# Patient Record
Sex: Female | Born: 1985 | Race: White | Hispanic: No | Marital: Married | Smoking: Never smoker
Health system: Southern US, Community
[De-identification: ages and names within clinical notes are randomized; demographics above are authoritative.]

## PROBLEM LIST (undated history)

## (undated) DIAGNOSIS — Z8619 Personal history of other infectious and parasitic diseases: Secondary | ICD-10-CM

## (undated) DIAGNOSIS — R87629 Unspecified abnormal cytological findings in specimens from vagina: Secondary | ICD-10-CM

## (undated) HISTORY — PX: MYRINGOTOMY: SHX2060

## (undated) HISTORY — PX: COLPOSCOPY: SHX161

## (undated) HISTORY — PX: WISDOM TOOTH EXTRACTION: SHX21

## (undated) HISTORY — DX: Personal history of other infectious and parasitic diseases: Z86.19

## (undated) HISTORY — DX: Unspecified abnormal cytological findings in specimens from vagina: R87.629

---

## 2014-11-29 LAB — OB RESULTS CONSOLE HIV ANTIBODY (ROUTINE TESTING): HIV: NONREACTIVE

## 2014-11-29 LAB — OB RESULTS CONSOLE GC/CHLAMYDIA
Chlamydia: NEGATIVE
GC PROBE AMP, GENITAL: NEGATIVE

## 2014-11-29 LAB — OB RESULTS CONSOLE RUBELLA ANTIBODY, IGM: RUBELLA: IMMUNE

## 2014-11-29 LAB — OB RESULTS CONSOLE RPR: RPR: NONREACTIVE

## 2014-11-29 LAB — OB RESULTS CONSOLE HEPATITIS B SURFACE ANTIGEN: Hepatitis B Surface Ag: NEGATIVE

## 2014-11-29 LAB — OB RESULTS CONSOLE ANTIBODY SCREEN: ANTIBODY SCREEN: NEGATIVE

## 2014-11-29 LAB — OB RESULTS CONSOLE ABO/RH: RH Type: POSITIVE

## 2015-04-10 ENCOUNTER — Inpatient Hospital Stay (HOSPITAL_COMMUNITY): Admission: AD | Admit: 2015-04-10 | Payer: Self-pay | Source: Ambulatory Visit | Admitting: Obstetrics and Gynecology

## 2015-07-01 ENCOUNTER — Telehealth (HOSPITAL_COMMUNITY): Payer: Self-pay | Admitting: *Deleted

## 2015-07-01 ENCOUNTER — Encounter (HOSPITAL_COMMUNITY): Payer: Self-pay | Admitting: *Deleted

## 2015-07-01 LAB — OB RESULTS CONSOLE GBS: GBS: NEGATIVE

## 2015-07-01 NOTE — Telephone Encounter (Signed)
Preadmission screen  

## 2015-07-02 ENCOUNTER — Inpatient Hospital Stay (HOSPITAL_COMMUNITY)
Admission: AD | Admit: 2015-07-02 | Discharge: 2015-07-04 | DRG: 766 | Disposition: A | Discharge status: Home / Self Care | Payer: BLUE CROSS/BLUE SHIELD | Source: Ambulatory Visit | Attending: Obstetrics and Gynecology | Admitting: Obstetrics and Gynecology

## 2015-07-02 ENCOUNTER — Inpatient Hospital Stay (HOSPITAL_COMMUNITY): Payer: BLUE CROSS/BLUE SHIELD | Admitting: Anesthesiology

## 2015-07-02 ENCOUNTER — Encounter (HOSPITAL_COMMUNITY)
Admission: AD | Disposition: A | Discharge status: Home / Self Care | Payer: Self-pay | Source: Ambulatory Visit | Attending: Obstetrics and Gynecology

## 2015-07-02 ENCOUNTER — Encounter (HOSPITAL_COMMUNITY): Payer: Self-pay | Admitting: *Deleted

## 2015-07-02 DIAGNOSIS — Z3A40 40 weeks gestation of pregnancy: Secondary | ICD-10-CM

## 2015-07-02 DIAGNOSIS — O324XX0 Maternal care for high head at term, not applicable or unspecified: Secondary | ICD-10-CM | POA: Diagnosis not present

## 2015-07-02 DIAGNOSIS — Z3403 Encounter for supervision of normal first pregnancy, third trimester: Secondary | ICD-10-CM | POA: Diagnosis present

## 2015-07-02 LAB — RPR: RPR Ser Ql: NONREACTIVE

## 2015-07-02 LAB — CBC
HEMATOCRIT: 44.6 % (ref 36.0–46.0)
Hemoglobin: 15.4 g/dL — ABNORMAL HIGH (ref 12.0–15.0)
MCH: 31.7 pg (ref 26.0–34.0)
MCHC: 34.5 g/dL (ref 30.0–36.0)
MCV: 91.8 fL (ref 78.0–100.0)
PLATELETS: 159 10*3/uL (ref 150–400)
RBC: 4.86 MIL/uL (ref 3.87–5.11)
RDW: 13.8 % (ref 11.5–15.5)
WBC: 13.2 10*3/uL — AB (ref 4.0–10.5)

## 2015-07-02 LAB — ABO/RH: ABO/RH(D): O POS

## 2015-07-02 LAB — TYPE AND SCREEN
ABO/RH(D): O POS
ANTIBODY SCREEN: NEGATIVE

## 2015-07-02 SURGERY — Surgical Case
Anesthesia: Epidural | Site: Abdomen

## 2015-07-02 MED ORDER — LIDOCAINE-EPINEPHRINE (PF) 2 %-1:200000 IJ SOLN
INTRAMUSCULAR | Status: AC
  Filled 2015-07-02: qty 20

## 2015-07-02 MED ORDER — SODIUM BICARBONATE 8.4 % IV SOLN
INTRAVENOUS | Status: DC | PRN
  Administered 2015-07-02 (×3): 5 mL via EPIDURAL

## 2015-07-02 MED ORDER — ACETAMINOPHEN 500 MG PO TABS: 1000.0000 mg | ORAL_TABLET | Freq: Four times a day (QID) | ORAL | Status: DC

## 2015-07-02 MED ORDER — NALOXONE HCL 2 MG/2ML IJ SOSY
1.0000 ug/kg/h | PREFILLED_SYRINGE | INTRAVENOUS | Status: DC | PRN
  Filled 2015-07-02: qty 2

## 2015-07-02 MED ORDER — LIDOCAINE HCL (PF) 1 % IJ SOLN: 30.0000 mL | INTRAMUSCULAR | Status: DC | PRN

## 2015-07-02 MED ORDER — NALOXONE HCL 0.4 MG/ML IJ SOLN: 0.4000 mg | INTRAMUSCULAR | Status: DC | PRN

## 2015-07-02 MED ORDER — ONDANSETRON HCL 4 MG/2ML IJ SOLN: 4.0000 mg | Freq: Three times a day (TID) | INTRAMUSCULAR | Status: DC | PRN

## 2015-07-02 MED ORDER — NALBUPHINE HCL 10 MG/ML IJ SOLN: 5.0000 mg | INTRAMUSCULAR | Status: DC | PRN

## 2015-07-02 MED ORDER — PHENYLEPHRINE 40 MCG/ML (10ML) SYRINGE FOR IV PUSH (FOR BLOOD PRESSURE SUPPORT)
PREFILLED_SYRINGE | INTRAVENOUS | Status: AC
  Filled 2015-07-02: qty 10

## 2015-07-02 MED ORDER — DIPHENHYDRAMINE HCL 50 MG/ML IJ SOLN: 12.5000 mg | INTRAMUSCULAR | Status: DC | PRN

## 2015-07-02 MED ORDER — BUTORPHANOL TARTRATE 1 MG/ML IJ SOLN: 1.0000 mg | INTRAMUSCULAR | Status: DC | PRN

## 2015-07-02 MED ORDER — OXYCODONE-ACETAMINOPHEN 5-325 MG PO TABS: 2.0000 | ORAL_TABLET | ORAL | Status: DC | PRN

## 2015-07-02 MED ORDER — LIDOCAINE HCL (PF) 1 % IJ SOLN
INTRAMUSCULAR | Status: DC | PRN
  Administered 2015-07-02: 6 mL via EPIDURAL
  Administered 2015-07-02: 4 mL

## 2015-07-02 MED ORDER — MEPERIDINE HCL 25 MG/ML IJ SOLN
INTRAMUSCULAR | Status: DC | PRN
  Administered 2015-07-02: 6 mg via INTRAVENOUS
  Administered 2015-07-02: 7 mg via INTRAVENOUS
  Administered 2015-07-02 (×2): 6 mg via INTRAVENOUS

## 2015-07-02 MED ORDER — SCOPOLAMINE 1 MG/3DAYS TD PT72
1.0000 | MEDICATED_PATCH | Freq: Once | TRANSDERMAL | Status: DC
  Filled 2015-07-02: qty 1

## 2015-07-02 MED ORDER — LACTATED RINGERS IV SOLN
INTRAVENOUS | Status: DC
  Administered 2015-07-02 (×4): via INTRAVENOUS

## 2015-07-02 MED ORDER — OXYTOCIN 10 UNIT/ML IJ SOLN: 2.5000 [IU]/h | INTRAMUSCULAR | Status: DC

## 2015-07-02 MED ORDER — LACTATED RINGERS IV SOLN: 500.0000 mL | INTRAVENOUS | Status: DC | PRN

## 2015-07-02 MED ORDER — NALBUPHINE HCL 10 MG/ML IJ SOLN: 5.0000 mg | Freq: Once | INTRAMUSCULAR | Status: DC | PRN

## 2015-07-02 MED ORDER — CEFAZOLIN SODIUM-DEXTROSE 2-3 GM-% IV SOLR: 2.0000 g | INTRAVENOUS | Status: DC

## 2015-07-02 MED ORDER — ONDANSETRON HCL 4 MG/2ML IJ SOLN
INTRAMUSCULAR | Status: DC | PRN
  Administered 2015-07-02: 4 mg via INTRAVENOUS

## 2015-07-02 MED ORDER — PHENYLEPHRINE HCL 10 MG/ML IJ SOLN
INTRAMUSCULAR | Status: DC | PRN
  Administered 2015-07-02: 40 ug via INTRAVENOUS
  Administered 2015-07-02 (×2): 80 ug via INTRAVENOUS
  Administered 2015-07-02: 40 ug via INTRAVENOUS
  Administered 2015-07-02: 80 ug via INTRAVENOUS

## 2015-07-02 MED ORDER — ERYTHROMYCIN 5 MG/GM OP OINT
TOPICAL_OINTMENT | OPHTHALMIC | Status: AC
  Filled 2015-07-02: qty 1

## 2015-07-02 MED ORDER — DIPHENHYDRAMINE HCL 25 MG PO CAPS: 25.0000 mg | ORAL_CAPSULE | Freq: Four times a day (QID) | ORAL | Status: DC | PRN

## 2015-07-02 MED ORDER — SODIUM CHLORIDE 0.9 % IJ SOLN: 3.0000 mL | INTRAMUSCULAR | Status: DC | PRN

## 2015-07-02 MED ORDER — PHENYLEPHRINE 40 MCG/ML (10ML) SYRINGE FOR IV PUSH (FOR BLOOD PRESSURE SUPPORT)
80.0000 ug | PREFILLED_SYRINGE | INTRAVENOUS | Status: DC | PRN
  Filled 2015-07-02: qty 20

## 2015-07-02 MED ORDER — OXYTOCIN 10 UNIT/ML IJ SOLN
40.0000 [IU] | INTRAVENOUS | Status: DC | PRN
  Administered 2015-07-02: 40 [IU] via INTRAVENOUS

## 2015-07-02 MED ORDER — EPHEDRINE 5 MG/ML INJ: 10.0000 mg | INTRAVENOUS | Status: DC | PRN

## 2015-07-02 MED ORDER — FENTANYL CITRATE (PF) 100 MCG/2ML IJ SOLN: 25.0000 ug | INTRAMUSCULAR | Status: DC | PRN

## 2015-07-02 MED ORDER — SIMETHICONE 80 MG PO CHEW
80.0000 mg | CHEWABLE_TABLET | ORAL | Status: DC
  Administered 2015-07-03 – 2015-07-04 (×2): 80 mg via ORAL
  Filled 2015-07-02 (×2): qty 1

## 2015-07-02 MED ORDER — MEPERIDINE HCL 25 MG/ML IJ SOLN: 6.2500 mg | INTRAMUSCULAR | Status: DC | PRN

## 2015-07-02 MED ORDER — CEFAZOLIN SODIUM-DEXTROSE 2-3 GM-% IV SOLR
INTRAVENOUS | Status: DC | PRN
  Administered 2015-07-02: 2 g via INTRAVENOUS

## 2015-07-02 MED ORDER — MEPERIDINE HCL 25 MG/ML IJ SOLN
INTRAMUSCULAR | Status: AC
  Filled 2015-07-02: qty 1

## 2015-07-02 MED ORDER — MENTHOL 3 MG MT LOZG: 1.0000 | LOZENGE | OROMUCOSAL | Status: DC | PRN

## 2015-07-02 MED ORDER — SODIUM CHLORIDE 0.9 % IR SOLN
Status: DC | PRN
  Administered 2015-07-02: 1000 mL

## 2015-07-02 MED ORDER — TETANUS-DIPHTH-ACELL PERTUSSIS 5-2.5-18.5 LF-MCG/0.5 IM SUSP
0.5000 mL | Freq: Once | INTRAMUSCULAR | Status: AC
  Administered 2015-07-04: 0.5 mL via INTRAMUSCULAR
  Filled 2015-07-02: qty 0.5

## 2015-07-02 MED ORDER — TERBUTALINE SULFATE 1 MG/ML IJ SOLN: 0.2500 mg | Freq: Once | INTRAMUSCULAR | Status: DC | PRN

## 2015-07-02 MED ORDER — ACETAMINOPHEN 500 MG PO TABS
1000.0000 mg | ORAL_TABLET | Freq: Four times a day (QID) | ORAL | Status: AC
  Administered 2015-07-03 (×3): 1000 mg via ORAL
  Filled 2015-07-02 (×3): qty 2

## 2015-07-02 MED ORDER — SIMETHICONE 80 MG PO CHEW
80.0000 mg | CHEWABLE_TABLET | Freq: Three times a day (TID) | ORAL | Status: DC
  Administered 2015-07-03 – 2015-07-04 (×4): 80 mg via ORAL
  Filled 2015-07-02 (×5): qty 1

## 2015-07-02 MED ORDER — DIBUCAINE 1 % RE OINT: 1.0000 "application " | TOPICAL_OINTMENT | RECTAL | Status: DC | PRN

## 2015-07-02 MED ORDER — OXYTOCIN 10 UNIT/ML IJ SOLN: 2.5000 [IU]/h | INTRAVENOUS | Status: AC

## 2015-07-02 MED ORDER — OXYTOCIN 10 UNIT/ML IJ SOLN
INTRAMUSCULAR | Status: AC
  Filled 2015-07-02: qty 4

## 2015-07-02 MED ORDER — FENTANYL 2.5 MCG/ML BUPIVACAINE 1/10 % EPIDURAL INFUSION (WH - ANES)
14.0000 mL/h | INTRAMUSCULAR | Status: DC | PRN
  Administered 2015-07-02: 14 mL/h via EPIDURAL
  Filled 2015-07-02: qty 125

## 2015-07-02 MED ORDER — MORPHINE SULFATE (PF) 0.5 MG/ML IJ SOLN
INTRAMUSCULAR | Status: AC
  Filled 2015-07-02: qty 10

## 2015-07-02 MED ORDER — ONDANSETRON HCL 4 MG/2ML IJ SOLN: 4.0000 mg | Freq: Four times a day (QID) | INTRAMUSCULAR | Status: DC | PRN

## 2015-07-02 MED ORDER — MORPHINE SULFATE (PF) 0.5 MG/ML IJ SOLN
INTRAMUSCULAR | Status: DC | PRN
  Administered 2015-07-02: 4 mg via EPIDURAL
  Administered 2015-07-02: 1 mg via INTRAVENOUS

## 2015-07-02 MED ORDER — SIMETHICONE 80 MG PO CHEW: 80.0000 mg | CHEWABLE_TABLET | ORAL | Status: DC | PRN

## 2015-07-02 MED ORDER — WITCH HAZEL-GLYCERIN EX PADS: 1.0000 "application " | MEDICATED_PAD | CUTANEOUS | Status: DC | PRN

## 2015-07-02 MED ORDER — OXYTOCIN BOLUS FROM INFUSION: 500.0000 mL | INTRAVENOUS | Status: DC

## 2015-07-02 MED ORDER — SCOPOLAMINE 1 MG/3DAYS TD PT72
MEDICATED_PATCH | TRANSDERMAL | Status: DC | PRN
  Administered 2015-07-02: 1 via TRANSDERMAL

## 2015-07-02 MED ORDER — CITRIC ACID-SODIUM CITRATE 334-500 MG/5ML PO SOLN
30.0000 mL | ORAL | Status: DC | PRN
  Administered 2015-07-02: 30 mL via ORAL
  Filled 2015-07-02: qty 15

## 2015-07-02 MED ORDER — ACETAMINOPHEN 325 MG PO TABS: 650.0000 mg | ORAL_TABLET | ORAL | Status: DC | PRN

## 2015-07-02 MED ORDER — OXYTOCIN 10 UNIT/ML IJ SOLN
1.0000 m[IU]/min | INTRAVENOUS | Status: DC
  Administered 2015-07-02: 1 m[IU]/min via INTRAVENOUS

## 2015-07-02 MED ORDER — OXYTOCIN 10 UNIT/ML IJ SOLN: 1.0000 m[IU]/min | INTRAVENOUS | Status: DC

## 2015-07-02 MED ORDER — LACTATED RINGERS IV SOLN
INTRAVENOUS | Status: DC
  Administered 2015-07-03: 02:00:00 via INTRAVENOUS

## 2015-07-02 MED ORDER — OXYTOCIN 10 UNIT/ML IJ SOLN
1.0000 m[IU]/min | INTRAVENOUS | Status: DC
  Filled 2015-07-02: qty 4

## 2015-07-02 MED ORDER — OXYCODONE-ACETAMINOPHEN 5-325 MG PO TABS: 1.0000 | ORAL_TABLET | ORAL | Status: DC | PRN

## 2015-07-02 MED ORDER — PRENATAL MULTIVITAMIN CH
1.0000 | ORAL_TABLET | Freq: Every day | ORAL | Status: DC
Start: 2015-07-03 — End: 2015-07-04
  Administered 2015-07-03 – 2015-07-04 (×2): 1 via ORAL
  Filled 2015-07-02 (×2): qty 1

## 2015-07-02 MED ORDER — ONDANSETRON HCL 4 MG/2ML IJ SOLN
INTRAMUSCULAR | Status: AC
  Filled 2015-07-02: qty 2

## 2015-07-02 MED ORDER — KETOROLAC TROMETHAMINE 30 MG/ML IJ SOLN
30.0000 mg | Freq: Once | INTRAMUSCULAR | Status: AC
  Administered 2015-07-02: 30 mg via INTRAVENOUS
  Filled 2015-07-02: qty 1

## 2015-07-02 MED ORDER — IBUPROFEN 600 MG PO TABS
600.0000 mg | ORAL_TABLET | Freq: Four times a day (QID) | ORAL | Status: DC
  Administered 2015-07-03 – 2015-07-04 (×7): 600 mg via ORAL
  Filled 2015-07-02 (×7): qty 1

## 2015-07-02 MED ORDER — SCOPOLAMINE 1 MG/3DAYS TD PT72
MEDICATED_PATCH | TRANSDERMAL | Status: AC
  Filled 2015-07-02: qty 1

## 2015-07-02 MED ORDER — ZOLPIDEM TARTRATE 5 MG PO TABS: 5.0000 mg | ORAL_TABLET | Freq: Every evening | ORAL | Status: DC | PRN

## 2015-07-02 MED ORDER — FLEET ENEMA 7-19 GM/118ML RE ENEM: 1.0000 | ENEMA | RECTAL | Status: DC | PRN

## 2015-07-02 MED ORDER — LANOLIN HYDROUS EX OINT: 1.0000 "application " | TOPICAL_OINTMENT | CUTANEOUS | Status: DC | PRN

## 2015-07-02 MED ORDER — SENNOSIDES-DOCUSATE SODIUM 8.6-50 MG PO TABS
2.0000 | ORAL_TABLET | ORAL | Status: DC
  Administered 2015-07-03 – 2015-07-04 (×2): 2 via ORAL
  Filled 2015-07-02 (×2): qty 2

## 2015-07-02 SURGICAL SUPPLY — 37 items
BENZOIN TINCTURE PRP APPL 2/3 (GAUZE/BANDAGES/DRESSINGS) ×3 IMPLANT
CLAMP CORD UMBIL (MISCELLANEOUS) IMPLANT
CLOSURE STERI STRIP 1/2 X4 (GAUZE/BANDAGES/DRESSINGS) ×3 IMPLANT
CLOSURE WOUND 1/2 X4 (GAUZE/BANDAGES/DRESSINGS)
CLOTH BEACON ORANGE TIMEOUT ST (SAFETY) ×3 IMPLANT
CONTAINER PREFILL 10% NBF 15ML (MISCELLANEOUS) IMPLANT
DRAPE SHEET LG 3/4 BI-LAMINATE (DRAPES) IMPLANT
DRSG OPSITE POSTOP 4X10 (GAUZE/BANDAGES/DRESSINGS) ×3 IMPLANT
DURAPREP 26ML APPLICATOR (WOUND CARE) ×3 IMPLANT
ELECT REM PT RETURN 9FT ADLT (ELECTROSURGICAL) ×3
ELECTRODE REM PT RTRN 9FT ADLT (ELECTROSURGICAL) ×1 IMPLANT
EXTRACTOR VACUUM M CUP 4 TUBE (SUCTIONS) IMPLANT
EXTRACTOR VACUUM M CUP 4' TUBE (SUCTIONS)
GLOVE BIO SURGEON STRL SZ8 (GLOVE) ×3 IMPLANT
GLOVE BIOGEL PI IND STRL 7.0 (GLOVE) ×1 IMPLANT
GLOVE BIOGEL PI INDICATOR 7.0 (GLOVE) ×2
GOWN STRL REUS W/TWL LRG LVL3 (GOWN DISPOSABLE) ×6 IMPLANT
KIT ABG SYR 3ML LUER SLIP (SYRINGE) ×3 IMPLANT
LIQUID BAND (GAUZE/BANDAGES/DRESSINGS) IMPLANT
NEEDLE HYPO 25X5/8 SAFETYGLIDE (NEEDLE) ×3 IMPLANT
NS IRRIG 1000ML POUR BTL (IV SOLUTION) ×3 IMPLANT
PACK C SECTION WH (CUSTOM PROCEDURE TRAY) ×3 IMPLANT
PAD ABD 7.5X8 STRL (GAUZE/BANDAGES/DRESSINGS) ×3 IMPLANT
PAD OB MATERNITY 4.3X12.25 (PERSONAL CARE ITEMS) ×3 IMPLANT
PENCIL SMOKE EVAC W/HOLSTER (ELECTROSURGICAL) ×3 IMPLANT
SPONGE GAUZE 4X4 12PLY STER LF (GAUZE/BANDAGES/DRESSINGS) ×6 IMPLANT
STRIP CLOSURE SKIN 1/2X4 (GAUZE/BANDAGES/DRESSINGS) IMPLANT
SUT MNCRL 0 VIOLET CTX 36 (SUTURE) ×4 IMPLANT
SUT MONOCRYL 0 CTX 36 (SUTURE) ×8
SUT PDS AB 0 CTX 60 (SUTURE) ×3 IMPLANT
SUT PLAIN 0 NONE (SUTURE) IMPLANT
SUT PLAIN 2 0 (SUTURE) ×2
SUT PLAIN 2 0 XLH (SUTURE) IMPLANT
SUT PLAIN ABS 2-0 CT1 27XMFL (SUTURE) ×1 IMPLANT
SUT VIC AB 4-0 KS 27 (SUTURE) ×3 IMPLANT
TOWEL OR 17X24 6PK STRL BLUE (TOWEL DISPOSABLE) ×3 IMPLANT
TRAY FOLEY CATH SILVER 14FR (SET/KITS/TRAYS/PACK) ×3 IMPLANT

## 2015-07-02 NOTE — MAU Note (Signed)
Was 5 cm at office yesterday morning.  Contractions every 5 min or less now.  Blood tinge mucus.  No leaking . Baby moving well.

## 2015-07-02 NOTE — Transfer of Care (Signed)
Immediate Anesthesia Transfer of Care Note  Patient: Cynthia [name]  Procedure(s) Performed: Procedure(s): CESAREAN SECTION (N/A)  Patient Location: PACU  Anesthesia Type:epidural;  Level of Consciousness: awake, alert , oriented and patient cooperative  Airway & Oxygen Therapy: Patient Spontanous Breathing  Post-op Assessment: Report given to RN and Post -op Vital signs reviewed and stable  Post vital signs: Reviewed and stable  Last Vitals:  Filed Vitals:   07/02/15 1600 07/02/15 1828  BP: 103/57   Pulse: 78   Temp: 36.6 C 36.8 C  Resp: 18 17    Complications: No apparent anesthesia complications

## 2015-07-02 NOTE — Op Note (Signed)
NAMMilinda Pointer:  [name], Cynthia              ACCOUNT NO.:  [id number]  MEDICAL RECORD NO.:  [id number]  LOCATION:  9104                          FACILITY:  [facility]  PHYSICIAN:  Guy SandiferJames E. Henderson Cloudomblin, M.D. DATE OF BIRTH:  06-30-1985  DATE OF PROCEDURE:  07/02/2015 DATE OF DISCHARGE:                              OPERATIVE REPORT   PREOPERATIVE DIAGNOSIS:  Arrest of descent.  POSTOPERATIVE DIAGNOSIS:  Arrest of descent.  PROCEDURE:  Primary low transverse cesarean section.  SURGEON:  Guy SandiferJames E. Henderson Cloudomblin, MD  ANESTHESIA:  Epidural, Belva AgeeKyle E. Jackson, MD  ESTIMATED BLOOD LOSS:  450 mL.  SPECIMENS:  None.  FINDINGS:  Viable female infant.  Apgars, birth weight, arterial cord, pH pending.  INDICATIONS AND CONSENT:  This patient is a 30 year old, G1, P0, at 7640- 2/7 weeks who presents in spontaneous labor.  Cervix was unchanged after several hours, and recommendation for cesarean section was made. Potential risks and complications were discussed with the patient preoperatively including, but not limited to, infection, organ damage, bleeding requiring transfusion of blood products with HIV and hepatitis acquisition, DVT, PE, pneumonia.  All questions were answered and consent was signed on the chart.  PROCEDURE IN DETAIL:  The patient was taken to the operating room, where she was identified and placed in dorsal supine position with a 15 degree left lateral wedge.  Her epidurals were augmented to a surgical level. Foley catheter was placed and she was prepped and draped in a sterile fashion per [facility] protocol.  After testing for adequate epidural anesthesia, skin was entered through a Pfannenstiel incision and dissection was carried out in layers of the peritoneum.  Peritoneum was incised and extended superiorly and inferiorly.  Vesicouterine peritoneum was taken down cephalolaterally and bladder flap was developed and the bladder blade was placed.  The uterus was incised in a low transverse  manner and the uterine cavity was entered bluntly with a hemostat.  Uterine incision was extended cephalolaterally with the fingers.  Vertex was then delivered.  Nuchal cord x3 was then reduced. Baby was delivered without difficulty.  Good cry and tone was noted. Cord was clamped and cut and the baby was handed to awaiting pediatrics team.  Placenta was manually delivered.  Uterine cavity was clean. Uterus was closed in 2 running locking imbricating layers of 0 Monocryl suture which achieves good hemostasis.  Anterior peritoneum was closed in running fashion with 0 Monocryl suture which was also used to reapproximate the pyramidalis muscle in midline.  Anterior rectus fascia was closed in a running fashion with a 0 looped PDS suture. Skin was closed with interrupted plain and the skin was closed in a subcuticular fashion with 4-0 Vicryl on a Keith needle.  Steri-Strips were applied.  All counts were correct.  The patient was taken to recovery room in stable condition.     Guy SandiferJames E. Henderson Cloudomblin, M.D.     JET/MEDQ  D:  07/02/2015  T:  07/02/2015  Job:  [id number]

## 2015-07-02 NOTE — Consult Note (Signed)
[facility]  Delivery Note:  C-section       07/02/2015  5:48 PM  I was called to the operating room at the request of the patient's obstetrician (Dr. Tomblin) for a primary c-section for failure to progress.  PRENATAL HX:  This is a 29 y/o G1P0 at 39 weeks who was admitted this morning in active labor, but infant delivered by c-section for failure to progress.  No pregnancy complications.  ROM was at 0619 this morning, so ROM ~12 hours.    DELIVERY:  Infant was vigorous at delivery, requiring no resuscitation other than standard warming, drying and stimulation.  APGARs 8 and 9.  Exam notable for molding, otherwise was within normal limits.  After 5 minutes, baby left with nurse to assist parents with skin-to-skin care.   _____________________ Electronically Signed By: Darlina Mccaughey, MD Neonatologist  

## 2015-07-02 NOTE — Anesthesia Postprocedure Evaluation (Signed)
Anesthesia Post Note  Patient: Cynthia [name]  Procedure(s) Performed: Procedure(s) (LRB): CESAREAN SECTION (N/A)  Patient location during evaluation: PACU Anesthesia Type: Spinal Level of consciousness: awake Pain management: satisfactory to patient Vital Signs Assessment: post-procedure vital signs reviewed and stable Respiratory status: spontaneous breathing Cardiovascular status: blood pressure returned to baseline Postop Assessment: no headache and spinal receding Anesthetic complications: no    Last Vitals:  Filed Vitals:   07/02/15 1915 07/02/15 1930  BP: 107/50 103/54  Pulse: 87 87  Temp:    Resp: 17 22    Last Pain:  Filed Vitals:   07/02/15 1935  PainSc: 0-No pain                 Dantae Meunier EDWARD

## 2015-07-02 NOTE — Progress Notes (Signed)
07/02/2015  8:15 PM  PATIENT:  Cynthia [name]  30 y.o. female  PRE-OPERATIVE DIAGNOSIS:  Arrest of descent  POST-OPERATIVE DIAGNOSIS:  Arrest of descent  PROCEDURE:  Procedure(s): CESAREAN SECTION (N/A)  SURGEON:  Surgeon(s) and Role:    * Harold HedgeJames Undray Allman, MD - Primary  PHYSICIAN ASSISTANT:   ASSISTANTS: none   ANESTHESIA:   epidural  EBL:  Total I/O In: 67 [I.V.:67] Out: 525 [Urine:525]  BLOOD ADMINISTERED:none  DRAINS: Urinary Catheter (Foley)   LOCAL MEDICATIONS USED:  NONE  SPECIMEN:  No Specimen  DISPOSITION OF SPECIMEN:  N/A  COUNTS:  YES  TOURNIQUET:  * No tourniquets in log *  DICTATION: .Other Dictation: Dictation Number [id number]  PLAN OF CARE: Admit to inpatient   PATIENT DISPOSITION:  PACU - hemodynamically stable.   Delay start of Pharmacological VTE agent (>24hrs) due to surgical blood loss or risk of bleeding: not applicable

## 2015-07-02 NOTE — Addendum Note (Signed)
Addendum  created 07/02/15 2218 by Cristela BlueKyle Belisa Eichholz, MD   Modules edited: Orders, PRL Based Order Sets

## 2015-07-02 NOTE — Progress Notes (Signed)
Cx 7/C/-2 IUPC placed FHT cat one

## 2015-07-02 NOTE — Anesthesia Preprocedure Evaluation (Signed)

## 2015-07-02 NOTE — H&P (Signed)
Cynthia [name] is a [age] y.o. female presenting for UCs. Reports U/S in office yesterday with EFW about 8#2oz. No ROM, no HA. Maternal Medical History:  Reason for admission: Contractions.   Contractions: Onset was 3-5 hours ago.    Fetal activity: Perceived fetal activity is normal.      OB History    Gravida Para Term Preterm AB TAB SAB Ectopic Multiple Living   1              Past Medical History  Diagnosis Date  . Hx of varicella   . Vaginal Pap smear, abnormal    Past Surgical History  Procedure Laterality Date  . Wisdom tooth extraction    . Myringotomy    . Colposcopy     Family History: family history is not on file. Social History:  reports that she has never smoked. She does not have any smokeless tobacco history on file. She reports that she does not drink alcohol or use illicit drugs.   Prenatal Transfer Tool  Maternal Diabetes: No Genetic Screening: Normal Maternal Ultrasounds/Referrals: Normal Fetal Ultrasounds or other Referrals:  None Maternal Substance Abuse:  No Significant Maternal Medications:  None Significant Maternal Lab Results:  None Other Comments:  None  Review of Systems  Eyes: Negative for blurred vision.  Gastrointestinal: Negative for abdominal pain.  Neurological: Negative for headaches.    Dilation: 6 Effacement (%): 100 Station: -2 Exam by:: Cynthia [name] Blood pressure 113/69, pulse 104, temperature 97.8 F (36.6 C), temperature source Oral, resp. rate 18, height 5\' 1"  (1.549 m), weight 163 lb 6 oz (74.106 kg), last menstrual period 09/18/2014, SpO2 99 %. Maternal Exam:  Uterine Assessment: Contraction strength is moderate.  Contraction frequency is regular.   Abdomen: Fetal presentation: vertex     Fetal Exam Fetal State Assessment: Category I - tracings are normal.     Physical Exam  Cardiovascular: Normal rate and regular rhythm.   Respiratory: Effort normal and breath sounds normal.  GI: Soft. There is no  tenderness.  Neurological: She has normal reflexes.    Cx 6/C/-2/vtx AROM clear Prenatal labs: ABO, Rh: O/Positive/-- (06/06 0000) Antibody: Negative (06/06 0000) Rubella: Immune (06/06 0000) RPR: Nonreactive (06/06 0000)  HBsAg: Negative (06/06 0000)  HIV: Non-reactive (06/06 0000)  GBS: Negative (01/06 0000)   Assessment/Plan: 30 yo G1P0 @ 40 2/7 weeks in labor  All questions answered  Cynthia [name] II,Cynthia [name] E 07/02/2015, 6:22 AM

## 2015-07-02 NOTE — Progress Notes (Signed)
Cx no change D/W patient and husband lack of progress over several hours Recommend cesarean section Risks D/W including infection, organ damage, bleeding/transfusion-HIV/Hep, DVT/PE, pneumonia. Patient states she understands and agrees

## 2015-07-02 NOTE — Anesthesia Procedure Notes (Signed)

## 2015-07-03 LAB — CBC
HEMATOCRIT: 35.2 % — AB (ref 36.0–46.0)
Hemoglobin: 11.7 g/dL — ABNORMAL LOW (ref 12.0–15.0)
MCH: 30.6 pg (ref 26.0–34.0)
MCHC: 33 g/dL (ref 30.0–36.0)
MCV: 92.9 fL (ref 78.0–100.0)
PLATELETS: 132 10*3/uL — AB (ref 150–400)
RBC: 3.79 MIL/uL — ABNORMAL LOW (ref 3.87–5.11)
RDW: 13.8 % (ref 11.5–15.5)
WBC: 12.1 10*3/uL — AB (ref 4.0–10.5)

## 2015-07-03 NOTE — Addendum Note (Signed)
Addendum  created 07/03/15 0803 by Renford DillsJanet L Ammaar Encina, CRNA   Modules edited: Clinical Notes   Clinical Notes:  File: [id number]

## 2015-07-03 NOTE — Progress Notes (Signed)
Subjective: Postpartum Day 1: Cesarean Delivery Patient reports tolerating PO.    Objective: Vital signs in last 24 hours: Temp:  [97.7 F (36.5 C)-98.6 F (37 C)] 97.8 F (36.6 C) (01/08 0300) Pulse Rate:  [25-154] 80 (01/08 0300) Resp:  [17-22] 20 (01/08 0300) BP: (91-130)/(36-105) 108/60 mmHg (01/08 0300) SpO2:  [74 %-100 %] 97 % (01/08 0300)  Physical Exam:  General: alert, cooperative and no distress Lochia: appropriate Uterine Fundus: firm Incision: healing well DVT Evaluation: No evidence of DVT seen on physical exam.   Recent Labs  07/02/15 0600 07/03/15 0552  HGB 15.4* 11.7*  HCT 44.6 35.2*    Assessment/Plan: Status post Cesarean section. Doing well postoperatively.  Continue current care.  Annastacia Duba II,Persephanie Laatsch E 07/03/2015, 6:47 AM

## 2015-07-03 NOTE — Lactation Note (Signed)
This note was copied from the chart of Boy Milinda Pointernna Auld. Lactation Consultation Note  Patient Name: Boy Milinda Pointernna Politte ZOXWR'UToday's Date: 07/03/2015 Reason for consult: Initial assessment;Other (Comment) (per mom baby recently fed at 1400. LC reviewed Doc Flow sheets with parents )  Parents had BF questions, LC reviewed basics - importance of skin to skin feedings, average feeding time, and importance of hand expressing. Mom has already started hand expressing and feeding with a spoon. Mom encouraged to call with feeding cues for feeding assessment. Mother informed of post-discharge support and given phone number to the lactation department, including services for phone call assistance;  out-patient appointments; and breastfeeding support group. List of other breastfeeding resources in the community given in the handout. Encouraged  mother to call for problems or concerns related to breastfeeding.   Maternal Data    Feeding Feeding Type:  (per mom baby last fed at 1400 for 45 mins, with swallows ) Length of feed: 40 min  LATCH Score/Interventions                Intervention(s): Breastfeeding basics reviewed     Lactation Tools Discussed/Used Tools:  (per mom has n't had to use the NS since the 1st 2 feedings. baby has fiqured it out )   Consult Status Consult Status: Follow-up Date: 07/03/15 Follow-up type: In-patient    Kathrin Greathouseorio, Rayli Wiederhold Ann 07/03/2015, 4:59 PM

## 2015-07-03 NOTE — Anesthesia Postprocedure Evaluation (Signed)
Anesthesia Post Note  Patient: Cynthia [name]  Procedure(s) Performed: Procedure(s) (LRB): CESAREAN SECTION (N/A)  Patient location during evaluation: Mother Baby Anesthesia Type: Epidural Level of consciousness: awake Pain management: pain level controlled Vital Signs Assessment: post-procedure vital signs reviewed and stable Respiratory status: spontaneous breathing Cardiovascular status: stable Postop Assessment: no headache, no backache, epidural receding, patient able to bend at knees, no signs of nausea or vomiting and adequate PO intake Anesthetic complications: no    Last Vitals:  Filed Vitals:   07/03/15 0300 07/03/15 0610  BP: 108/60 107/62  Pulse: 80 68  Temp: 36.6 C 36.9 C  Resp: 20 20    Last Pain:  Filed Vitals:   07/03/15 0655  PainSc: 2                  [name]

## 2015-07-04 ENCOUNTER — Encounter (HOSPITAL_COMMUNITY): Payer: Self-pay | Admitting: Obstetrics and Gynecology

## 2015-07-04 MED ORDER — IBUPROFEN 600 MG PO TABS: 600.0000 mg | ORAL_TABLET | Freq: Four times a day (QID) | ORAL | Status: DC

## 2015-07-04 NOTE — Progress Notes (Signed)
Subjective: Postpartum Day 2: Cesarean Delivery Patient reports tolerating PO, + flatus and no problems voiding.    Objective: Vital signs in last 24 hours: Temp:  [97.8 F (36.6 C)-98 F (36.7 C)] 97.8 F (36.6 C) (01/09 0607) Pulse Rate:  [63-78] 66 (01/09 0607) Resp:  [16-20] 20 (01/09 0607) BP: (98-105)/(51-71) 98/58 mmHg (01/09 0607) SpO2:  [97 %-99 %] 98 % (01/08 1751)  Physical Exam:  General: alert and cooperative Lochia: appropriate Uterine Fundus: firm Incision: healing well DVT Evaluation: No evidence of DVT seen on physical exam. Negative Homan's sign. No cords or calf tenderness. Calf/Ankle edema is present.   Recent Labs  07/02/15 0600 07/03/15 0552  HGB 15.4* 11.7*  HCT 44.6 35.2*    Assessment/Plan: Status post Cesarean section. Doing well postoperatively.  Desires circ prior to discharge.  Ashari Llewellyn G 07/04/2015, 8:42 AM

## 2015-07-04 NOTE — Lactation Note (Signed)
This note was copied from the chart of Cynthia [name]. Lactation Consultation Note  Patient Name: Cynthia Milinda Pointernna Schweigert ZOXWR'UToday's Date: 07/04/2015 Reason for consult: Follow-up assessment  Baby 44 hours old. Mom nursing baby when this LC entered the room. Baby latched deeply, suckling rhythmically with intermittent swallows. Mom stated that she has had some nipple soreness. Demonstrated to parents how to flange baby's lower lip and mom reported increased comfort. Also demonstrated to mom how to keep baby closely tucked in close to her breast. Baby nursed well for 20 minutes with intermittent swallows, and then mom's nipple perfectly round when baby pulled away. Baby circumcised earlier this morning, so enc mom to offer lots of STS and attempts at the breast. Enc mom to call for assistance with latching as needed.  Maternal Data    Feeding Feeding Type: Breast Fed Length of feed: 20 min  LATCH Score/Interventions Latch: Grasps breast easily, tongue down, lips flanged, rhythmical sucking. Intervention(s): Skin to skin  Audible Swallowing: Spontaneous and intermittent Intervention(s): Skin to skin;Hand expression  Type of Nipple: Everted at rest and after stimulation Intervention(s): Shells  Comfort (Breast/Nipple): Filling, red/small blisters or bruises, mild/mod discomfort  Problem noted: Mild/Moderate discomfort  Hold (Positioning): Assistance needed to correctly position infant at breast and maintain latch.  LATCH Score: 8  Lactation Tools Discussed/Used     Consult Status Consult Status: Follow-up Date: 07/05/15 Follow-up type: In-patient    Geralynn OchsWILLIARD, Nyela Cortinas 07/04/2015, 1:56 PM

## 2015-07-05 ENCOUNTER — Inpatient Hospital Stay (HOSPITAL_COMMUNITY): Admission: RE | Admit: 2015-07-05 | Payer: BLUE CROSS/BLUE SHIELD | Source: Ambulatory Visit

## 2015-07-07 ENCOUNTER — Telehealth (HOSPITAL_COMMUNITY): Payer: Self-pay | Admitting: Lactation Services

## 2015-07-07 NOTE — Telephone Encounter (Signed)
Between 11 pm to 330 am the baby was on the breast constantly. Baby is 615 days old. He has had 6 wet, 5 BM, and bf 14 times, she thinks. Birth wt 7 lb 2 oz, at DC 6 lb 13 oz, on 06/28/15 wt was 7 lb 0 oz at the MD office. Mom reports very sore nipples, no skin break down or bruising reported by mom. She thinks that most of the time he gets a deep a latch but other times he has a shallow latch. This is mom's 1st baby. She had + breast changes during pregnancy. Denies medical conditions, medications, surgery, or trauma. She can tell that her breast are more full and they are leaking. After baby eats the breast are softer and she can see along with hear baby swallow. She can see milk in the baby's mouth when he is done. Denies any medical issues with baby.  Encouraged mom to keep better track of feedings and voids. Watch for the swallowing and get a good latch every feeding. Call back and make an OP apt for a feeding assessment if this is not getting better in a couple of days.

## 2015-07-11 ENCOUNTER — Encounter (HOSPITAL_COMMUNITY): Payer: Self-pay | Admitting: *Deleted

## 2015-07-14 NOTE — Discharge Summary (Signed)
Obstetric Discharge Summary Reason for Admission: onset of labor Prenatal Procedures: ultrasound Intrapartum Procedures: cesarean: low cervical, transverse Postpartum Procedures: none Complications-Operative and Postpartum: none HEMOGLOBIN  Date Value Ref Range Status  07/03/2015 11.7* 12.0 - 15.0 [name]/dL Final    Comment:    DELTA CHECK NOTED REPEATED TO VERIFY    HCT  Date Value Ref Range Status  07/03/2015 35.2* 36.0 - 46.0 % Final    Physical Exam:  General: alert and cooperative Lochia: appropriate Uterine Fundus: firm Incision: healing well DVT Evaluation: No evidence of DVT seen on physical exam. Negative Homan's sign. No cords or calf tenderness. No significant calf/ankle edema.  Discharge Diagnoses: Term Pregnancy-delivered  Discharge Information: Date: 07/14/2015 Activity: pelvic rest Diet: routine Medications: PNV and Ibuprofen Condition: stable Instructions: refer to practice specific booklet Discharge to: home   Newborn Data: Live born female  Birth Weight: 7 lb 2.6 oz (3250 [name]) APGAR: 8, 9  Home with mother.  Cynthia [name] 07/14/2015, 8:59 AM

## 2015-08-13 ENCOUNTER — Telehealth (HOSPITAL_COMMUNITY): Payer: Self-pay | Admitting: Lactation Services

## 2015-08-13 NOTE — Telephone Encounter (Signed)
Cynthia [name] called & left a voicemail with a question about pumping. Called pt back & Cynthia [name] stated that she wanted to know how pumping worked & if it mattered whether she pumped at the exact same time every day when she was away from her baby. Cynthia [name] reports she will be away from her baby ~4-5 hrs most days of the week. Pt reports that she has been pumping some and gets ~2 oz. Encouraged Cynthia [name] to pump ~30 mins after a morning feed and then to BF before she leaves her baby & then aim to pump the number of times he will get a bottle while she is away. Cynthia [name] asked how much he should be drinking now (83wks old)- discussed how intake for his age is ~2-3 oz but that sometimes babies take more so to go by his hunger cues. Suggested Cynthia [name] could do some power pumping on a day off if she was nervous about not having enough milk stored for when she is away. Cynthia [name] reported no other questions. Encouraged Cynthia [name] to call if she had any others.

## 2015-08-17 ENCOUNTER — Telehealth (HOSPITAL_COMMUNITY): Payer: Self-pay | Admitting: Lactation Services

## 2015-08-17 NOTE — Telephone Encounter (Signed)
Baby had slept for a long period (6 hrs) and Mom awoke w/very full breasts and a section of her L breast was sore. Mom had felt chills briefly, but has been feeding & pumping. Now her breasts feel better and she can no longer feel the sore spot. Mom denies streaks on her breasts, fever, etc. Mom reassured. Mom mentioned a sore spot on her nipple. Mom told she could apply coconut oil, if she'd like.  Glenetta Hew, RN, IBCLC

## 2015-09-26 ENCOUNTER — Telehealth (HOSPITAL_COMMUNITY): Payer: Self-pay | Admitting: Lactation Services

## 2015-09-26 NOTE — Telephone Encounter (Signed)
Tobi Bastosnna , called with breast feeding questions in regards to pumping, plugged duct on the left breast ( resolved Sunday) , and decreased volume on that side while pumping.  Per mom reports the baby is 2412 weeks old , breast feeding has gone well both breast. Her baby routinely feed only one breast per feeding and she doesn't always release  The 2nd breast down. Per mom started back to work today part- time form 8-1 p so she only has to pump once ( 1030 am). Today she pumped at work and expressed 3 oz off the  Right and 1 oz off the left ( breast plug was located in the last few days). Per mom feels the plug is resolved. LC asked mom is there were any tender areas, pinkish, or streaking up her breast  Or S/S elevated temp, chills, flu -like S/S and mom said no,. Per mom concerned due the expressed milk being so much less compared to the right.  LC recommended tonight for feedings latch the baby on the left breast has much as possible , if he is fussy , re-latch on the right and then 1/2 way through the feeding - release on the left to enhance stimulation.  Or Feed the baby on the right and pump on the left at the same time due to the baby only feeding normally one breast at a feeding. Also prior to latch on the left breast , warm moist heat ( wash cloth ) , massage , hand  Express, latch. LC encouraged mom if the recommendations don't seem to help to call [facility] back. Mom receptive and appreciative per her thank you.

## 2015-09-27 ENCOUNTER — Telehealth (HOSPITAL_COMMUNITY): Payer: Self-pay

## 2015-09-27 NOTE — Telephone Encounter (Signed)
Mom called with questions regarding pump supply while she is at work and how much baby is getting from bottle by caregiver.  Explained supply is better in the morning, explained it may take a week or two to get a good supply when separated from baby due to current stress she may notice a dip in supply.  Encouraged mom to discussed paced bottle feeding with caregiver and allow baby 15-20 minutes before offering more breastmilk than baby would normally take.  Mom feels encouraged and LC gave online resources to explore further.

## 2015-10-06 ENCOUNTER — Telehealth (HOSPITAL_COMMUNITY): Payer: Self-pay | Admitting: Lactation Services

## 2015-10-06 NOTE — Telephone Encounter (Signed)
Ms. Cynthia [name] called reporting her milk supply decreasing in the left breast. Her baby is now 383 months old. She has returned to work part time, 4 days per week for 4-5 hours 2 weeks ago.  Mom is BF before work, pumping 1 time at work for 10-15 minutes, then resumes BF at home. She reports pumping approximately 4-6 oz total but has noticed a decrease in the amount she receives from the left breast and is concerned. Baby is usually at the breast 7-8 times per day for 15-20 minutes, usually 1 breast per feeding but Mom noticed since her milk supply has decreased on the left breast baby is not as satisfied with nursing only on left breast. LC advised Mom to continue BF before going to work but to also pump the left breast for 15 minutes. Pump at work at least the 1 time. When she returns home, BF baby then pump again to really empty the left breast and encourage an increase in milk production. Block feedings - 2 feedings in a row starting on the left breast, after this resume to alternating breast each feeding.  Try some block feeding on the left breast at home, pump right breast as needed to prevent engorgement. Advised baby may also be going thru growth spurt and need to BF from both breasts some feedings.  Discussed Fenugreek if Mom does not see improvement, advised to call back if no improvement.

## 2016-08-10 LAB — OB RESULTS CONSOLE ANTIBODY SCREEN: Antibody Screen: NEGATIVE

## 2016-08-10 LAB — OB RESULTS CONSOLE RPR: RPR: NONREACTIVE

## 2016-08-10 LAB — OB RESULTS CONSOLE ABO/RH: RH TYPE: POSITIVE

## 2016-08-10 LAB — OB RESULTS CONSOLE HEPATITIS B SURFACE ANTIGEN: HEP B S AG: NEGATIVE

## 2016-08-10 LAB — OB RESULTS CONSOLE RUBELLA ANTIBODY, IGM: Rubella: IMMUNE

## 2016-08-10 LAB — OB RESULTS CONSOLE GC/CHLAMYDIA
Chlamydia: NEGATIVE
Gonorrhea: NEGATIVE

## 2016-08-10 LAB — OB RESULTS CONSOLE HIV ANTIBODY (ROUTINE TESTING): HIV: NONREACTIVE

## 2016-11-14 ENCOUNTER — Other Ambulatory Visit (HOSPITAL_COMMUNITY): Payer: Self-pay | Admitting: Obstetrics and Gynecology

## 2016-11-14 DIAGNOSIS — Z3689 Encounter for other specified antenatal screening: Secondary | ICD-10-CM

## 2016-11-26 ENCOUNTER — Encounter (HOSPITAL_COMMUNITY): Payer: Self-pay | Admitting: *Deleted

## 2016-11-27 ENCOUNTER — Other Ambulatory Visit (HOSPITAL_COMMUNITY): Payer: Self-pay | Admitting: *Deleted

## 2016-11-27 ENCOUNTER — Ambulatory Visit (HOSPITAL_COMMUNITY)
Admission: RE | Admit: 2016-11-27 | Discharge: 2016-11-27 | Disposition: A | Discharge status: Home / Self Care | Payer: 59 | Source: Ambulatory Visit | Attending: Obstetrics and Gynecology | Admitting: Obstetrics and Gynecology

## 2016-11-27 ENCOUNTER — Encounter (HOSPITAL_COMMUNITY): Payer: Self-pay

## 2016-11-27 ENCOUNTER — Ambulatory Visit (HOSPITAL_COMMUNITY): Admission: RE | Admit: 2016-11-27 | Payer: 59 | Source: Ambulatory Visit

## 2016-11-27 DIAGNOSIS — O359XX0 Maternal care for (suspected) fetal abnormality and damage, unspecified, not applicable or unspecified: Secondary | ICD-10-CM | POA: Insufficient documentation

## 2016-11-27 DIAGNOSIS — Z3689 Encounter for other specified antenatal screening: Secondary | ICD-10-CM | POA: Diagnosis present

## 2016-11-27 DIAGNOSIS — O34219 Maternal care for unspecified type scar from previous cesarean delivery: Secondary | ICD-10-CM | POA: Diagnosis not present

## 2016-11-27 DIAGNOSIS — Z3A25 25 weeks gestation of pregnancy: Secondary | ICD-10-CM | POA: Diagnosis not present

## 2016-11-27 DIAGNOSIS — IMO0001 Reserved for inherently not codable concepts without codable children: Secondary | ICD-10-CM

## 2016-11-27 DIAGNOSIS — O358XX0 Maternal care for other (suspected) fetal abnormality and damage, not applicable or unspecified: Principal | ICD-10-CM

## 2017-01-08 ENCOUNTER — Ambulatory Visit (HOSPITAL_COMMUNITY)
Admission: RE | Admit: 2017-01-08 | Discharge: 2017-01-08 | Disposition: A | Discharge status: Home / Self Care | Payer: 59 | Source: Ambulatory Visit | Attending: Obstetrics and Gynecology | Admitting: Obstetrics and Gynecology

## 2017-01-08 ENCOUNTER — Other Ambulatory Visit (HOSPITAL_COMMUNITY): Payer: Self-pay | Admitting: Obstetrics and Gynecology

## 2017-01-08 ENCOUNTER — Encounter (HOSPITAL_COMMUNITY): Payer: Self-pay

## 2017-01-08 DIAGNOSIS — IMO0001 Reserved for inherently not codable concepts without codable children: Secondary | ICD-10-CM

## 2017-01-08 DIAGNOSIS — O358XX0 Maternal care for other (suspected) fetal abnormality and damage, not applicable or unspecified: Secondary | ICD-10-CM | POA: Insufficient documentation

## 2017-01-08 DIAGNOSIS — O34219 Maternal care for unspecified type scar from previous cesarean delivery: Secondary | ICD-10-CM

## 2017-01-08 DIAGNOSIS — O34211 Maternal care for low transverse scar from previous cesarean delivery: Secondary | ICD-10-CM | POA: Insufficient documentation

## 2017-01-08 DIAGNOSIS — Z3A31 31 weeks gestation of pregnancy: Secondary | ICD-10-CM | POA: Insufficient documentation

## 2017-01-08 DIAGNOSIS — Z362 Encounter for other antenatal screening follow-up: Secondary | ICD-10-CM | POA: Insufficient documentation

## 2017-01-08 DIAGNOSIS — O359XX0 Maternal care for (suspected) fetal abnormality and damage, unspecified, not applicable or unspecified: Secondary | ICD-10-CM

## 2017-01-10 ENCOUNTER — Other Ambulatory Visit (HOSPITAL_COMMUNITY): Payer: Self-pay

## 2017-01-10 ENCOUNTER — Encounter (HOSPITAL_COMMUNITY): Payer: Self-pay

## 2017-02-14 ENCOUNTER — Encounter (HOSPITAL_COMMUNITY): Payer: Self-pay

## 2017-02-15 ENCOUNTER — Telehealth (HOSPITAL_COMMUNITY): Payer: Self-pay | Admitting: *Deleted

## 2017-02-15 NOTE — Telephone Encounter (Signed)
Preadmission screen  

## 2017-02-18 ENCOUNTER — Encounter (HOSPITAL_COMMUNITY): Payer: Self-pay

## 2017-02-19 ENCOUNTER — Other Ambulatory Visit (HOSPITAL_COMMUNITY): Payer: Self-pay | Admitting: Obstetrics and Gynecology

## 2017-02-19 ENCOUNTER — Ambulatory Visit (HOSPITAL_COMMUNITY)
Admission: RE | Admit: 2017-02-19 | Discharge: 2017-02-19 | Disposition: A | Discharge status: Home / Self Care | Payer: 59 | Source: Ambulatory Visit | Attending: Obstetrics and Gynecology | Admitting: Obstetrics and Gynecology

## 2017-02-19 ENCOUNTER — Encounter (HOSPITAL_COMMUNITY): Payer: Self-pay

## 2017-02-19 DIAGNOSIS — O34219 Maternal care for unspecified type scar from previous cesarean delivery: Secondary | ICD-10-CM | POA: Diagnosis not present

## 2017-02-19 DIAGNOSIS — O358XX0 Maternal care for other (suspected) fetal abnormality and damage, not applicable or unspecified: Secondary | ICD-10-CM | POA: Diagnosis not present

## 2017-02-19 DIAGNOSIS — IMO0001 Reserved for inherently not codable concepts without codable children: Secondary | ICD-10-CM

## 2017-02-19 DIAGNOSIS — Z3A37 37 weeks gestation of pregnancy: Secondary | ICD-10-CM | POA: Diagnosis not present

## 2017-02-19 DIAGNOSIS — Z362 Encounter for other antenatal screening follow-up: Secondary | ICD-10-CM | POA: Diagnosis not present

## 2017-02-21 NOTE — Patient Instructions (Signed)
[address] Mil[address] Po[address]na Butt  02/21/2017   Your procedure is scheduled on:  03/04/2017  Enter through the [facility] at 1100 AM.  Pick up the phone at the desk and dial [phone].   Call this number if you have problems the morning of surgery: [phone]   Remember:   Do not eat food:After Midnight.  Do not drink clear liquids: After Midnight.  Take these medicines the morning of surgery with A SIP OF WATER: none   Do not wear jewelry, make-up or nail polish.  Do not wear lotions, powders, or perfumes. Do not wear deodorant.  Do not shave 48 hours prior to surgery.  Do not bring valuables to the hospital.  [facility] is not   responsible for any belongings or valuables brought to the hospital.  Contacts, dentures or bridgework may not be worn into surgery.  Leave suitcase in the car. After surgery it may be brought to your room.  For patients admitted to the hospital, checkout time is 11:00 AM the day of              discharge.   Patients discharged the day of surgery will not be allowed to drive             home.  Name and phone number of your driver: na  Special Instructions:   N/A   Please read over the following fact sheets that you were given:   Surgical Site Infection Prevention

## 2017-03-01 ENCOUNTER — Encounter (HOSPITAL_COMMUNITY)
Admission: RE | Admit: 2017-03-01 | Discharge: 2017-03-01 | Disposition: A | Discharge status: Home / Self Care | Payer: 59 | Source: Ambulatory Visit | Attending: Obstetrics and Gynecology | Admitting: Obstetrics and Gynecology

## 2017-03-01 LAB — CBC
HCT: 38.8 % (ref 36.0–46.0)
HEMOGLOBIN: 12.9 g/dL (ref 12.0–15.0)
MCH: 30 pg (ref 26.0–34.0)
MCHC: 33.2 g/dL (ref 30.0–36.0)
MCV: 90.2 fL (ref 78.0–100.0)
Platelets: 148 10*3/uL — ABNORMAL LOW (ref 150–400)
RBC: 4.3 MIL/uL (ref 3.87–5.11)
RDW: 14.8 % (ref 11.5–15.5)
WBC: 10.1 10*3/uL (ref 4.0–10.5)

## 2017-03-01 NOTE — H&P (Signed)
Cynthia [name] is a 31 y.o. female presenting for repeat cesarean section. 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 Serial MFM U/S for growth. OB History    Gravida Para Term Preterm AB Living   2 1 1     1    SAB TAB Ectopic Multiple Live Births         0 1     Past Medical History:  Diagnosis Date  . Hx of varicella   . Vaginal Pap smear, abnormal    Past Surgical History:  Procedure Laterality Date  . CESAREAN SECTION N/A 07/02/2015   Procedure: CESAREAN SECTION;  Surgeon: Harold HedgeJames Pualani Borah, MD;  Location: [facility] ORS;  Service: Obstetrics;  Laterality: N/A;  . COLPOSCOPY    . MYRINGOTOMY    . WISDOM TOOTH EXTRACTION     Family History: family history is not on file. Social History:  reports that she has never smoked. She has never used smokeless tobacco. She reports that she does not drink alcohol or use drugs.     Maternal Diabetes: No Genetic Screening: Declined Maternal Ultrasounds/Referrals: Abnormal:  Findings:   Fetal Kidney Anomalies Fetal Ultrasounds or other Referrals:  MFM Maternal Substance Abuse:  No Significant Maternal Medications:  None Significant Maternal Lab Results:  None Other Comments:  None  Review of Systems  Eyes: Negative for blurred vision.  Gastrointestinal: Negative for abdominal pain.  Neurological: Negative for headaches.   History   Last menstrual period 05/27/2016, unknown if currently breastfeeding. Exam Physical Exam  Cardiovascular: Normal rate and regular rhythm.   Respiratory: Effort normal.  GI: Soft.  Neurological: She has normal reflexes.    Prenatal labs: ABO, Rh: O/Positive/-- (02/16 0000) Antibody: Negative (02/16 0000) Rubella: Immune (02/16 0000) RPR: Nonreactive (02/16 0000)  HBsAg: Negative (02/16 0000)  HIV: Non-reactive (02/16 0000)  GBS:     Assessment/Plan: 31 yo G2P1 for repeat cesarean section Fetal pelvic kidney with good growth on U/S>>FU with peds after delivery   Cynthia [name],Cynthia [name] 03/01/2017, 1:26  PM

## 2017-03-04 ENCOUNTER — Inpatient Hospital Stay (HOSPITAL_COMMUNITY)
Admission: RE | Admit: 2017-03-04 | Discharge: 2017-03-06 | DRG: 766 | Disposition: A | Discharge status: Home / Self Care | Payer: 59 | Source: Ambulatory Visit | Attending: Obstetrics and Gynecology | Admitting: Obstetrics and Gynecology

## 2017-03-04 ENCOUNTER — Inpatient Hospital Stay (HOSPITAL_COMMUNITY): Payer: 59 | Admitting: Anesthesiology

## 2017-03-04 ENCOUNTER — Encounter (HOSPITAL_COMMUNITY)
Admission: RE | Disposition: A | Discharge status: Home / Self Care | Payer: Self-pay | Source: Ambulatory Visit | Attending: Obstetrics and Gynecology

## 2017-03-04 ENCOUNTER — Encounter (HOSPITAL_COMMUNITY): Payer: Self-pay | Admitting: *Deleted

## 2017-03-04 DIAGNOSIS — O358XX0 Maternal care for other (suspected) fetal abnormality and damage, not applicable or unspecified: Secondary | ICD-10-CM | POA: Diagnosis present

## 2017-03-04 DIAGNOSIS — O34211 Maternal care for low transverse scar from previous cesarean delivery: Secondary | ICD-10-CM | POA: Diagnosis present

## 2017-03-04 DIAGNOSIS — Z3A39 39 weeks gestation of pregnancy: Secondary | ICD-10-CM

## 2017-03-04 DIAGNOSIS — Z98891 History of uterine scar from previous surgery: Secondary | ICD-10-CM

## 2017-03-04 LAB — CBC
HEMATOCRIT: 39.5 % (ref 36.0–46.0)
Hemoglobin: 13.3 g/dL (ref 12.0–15.0)
MCH: 30.4 pg (ref 26.0–34.0)
MCHC: 33.7 g/dL (ref 30.0–36.0)
MCV: 90.2 fL (ref 78.0–100.0)
Platelets: 142 10*3/uL — ABNORMAL LOW (ref 150–400)
RBC: 4.38 MIL/uL (ref 3.87–5.11)
RDW: 15.2 % (ref 11.5–15.5)
WBC: 9.1 10*3/uL (ref 4.0–10.5)

## 2017-03-04 LAB — TYPE AND SCREEN
ABO/RH(D): O POS
Antibody Screen: NEGATIVE

## 2017-03-04 SURGERY — Surgical Case
Anesthesia: Spinal | Wound class: Clean Contaminated

## 2017-03-04 MED ORDER — SIMETHICONE 80 MG PO CHEW
80.0000 mg | CHEWABLE_TABLET | ORAL | Status: DC
  Administered 2017-03-04 – 2017-03-05 (×2): 80 mg via ORAL
  Filled 2017-03-04 (×2): qty 1

## 2017-03-04 MED ORDER — SOD CITRATE-CITRIC ACID 500-334 MG/5ML PO SOLN
30.0000 mL | ORAL | Status: AC
  Administered 2017-03-04: 30 mL via ORAL
  Filled 2017-03-04: qty 15

## 2017-03-04 MED ORDER — BUPIVACAINE IN DEXTROSE 0.75-8.25 % IT SOLN
INTRATHECAL | Status: DC | PRN
  Administered 2017-03-04: 1.1 mL via INTRATHECAL

## 2017-03-04 MED ORDER — DIPHENHYDRAMINE HCL 25 MG PO CAPS: 25.0000 mg | ORAL_CAPSULE | Freq: Four times a day (QID) | ORAL | Status: DC | PRN

## 2017-03-04 MED ORDER — PANTOPRAZOLE SODIUM 40 MG PO TBEC
40.0000 mg | DELAYED_RELEASE_TABLET | Freq: Once | ORAL | Status: AC
  Administered 2017-03-04: 40 mg via ORAL
  Filled 2017-03-04: qty 1

## 2017-03-04 MED ORDER — SODIUM CHLORIDE 0.9 % IV SOLN
INTRAVENOUS | Status: DC | PRN
  Administered 2017-03-04 (×2): 80 ug via INTRAVENOUS

## 2017-03-04 MED ORDER — PRENATAL MULTIVITAMIN CH
1.0000 | ORAL_TABLET | Freq: Every day | ORAL | Status: DC
  Administered 2017-03-05 – 2017-03-06 (×2): 1 via ORAL
  Filled 2017-03-04 (×2): qty 1

## 2017-03-04 MED ORDER — OXYCODONE HCL 5 MG PO TABS: 5.0000 mg | ORAL_TABLET | ORAL | Status: DC | PRN

## 2017-03-04 MED ORDER — TETANUS-DIPHTH-ACELL PERTUSSIS 5-2.5-18.5 LF-MCG/0.5 IM SUSP
0.5000 mL | Freq: Once | INTRAMUSCULAR | Status: AC
  Administered 2017-03-06: 0.5 mL via INTRAMUSCULAR
  Filled 2017-03-04: qty 0.5

## 2017-03-04 MED ORDER — SIMETHICONE 80 MG PO CHEW
80.0000 mg | CHEWABLE_TABLET | Freq: Three times a day (TID) | ORAL | Status: DC
  Administered 2017-03-04 – 2017-03-06 (×4): 80 mg via ORAL
  Filled 2017-03-04 (×4): qty 1

## 2017-03-04 MED ORDER — COCONUT OIL OIL
1.0000 "application " | TOPICAL_OIL | Status: DC | PRN
  Administered 2017-03-06: 1 via TOPICAL
  Filled 2017-03-04: qty 120

## 2017-03-04 MED ORDER — ACETAMINOPHEN 325 MG PO TABS: 650.0000 mg | ORAL_TABLET | ORAL | Status: DC | PRN

## 2017-03-04 MED ORDER — DEXAMETHASONE SODIUM PHOSPHATE 10 MG/ML IJ SOLN
INTRAMUSCULAR | Status: DC | PRN
  Administered 2017-03-04: 10 mg via INTRAVENOUS

## 2017-03-04 MED ORDER — MORPHINE SULFATE (PF) 0.5 MG/ML IJ SOLN
INTRAMUSCULAR | Status: AC
  Filled 2017-03-04: qty 10

## 2017-03-04 MED ORDER — SIMETHICONE 80 MG PO CHEW: 80.0000 mg | CHEWABLE_TABLET | ORAL | Status: DC | PRN

## 2017-03-04 MED ORDER — BUPIVACAINE IN DEXTROSE 0.75-8.25 % IT SOLN
INTRATHECAL | Status: AC
  Filled 2017-03-04: qty 2

## 2017-03-04 MED ORDER — ONDANSETRON HCL 4 MG/2ML IJ SOLN
INTRAMUSCULAR | Status: DC | PRN
  Administered 2017-03-04: 4 mg via INTRAVENOUS

## 2017-03-04 MED ORDER — LACTATED RINGERS IV SOLN
INTRAVENOUS | Status: DC
  Administered 2017-03-04: 14:00:00 via INTRAVENOUS

## 2017-03-04 MED ORDER — FENTANYL CITRATE (PF) 100 MCG/2ML IJ SOLN
INTRAMUSCULAR | Status: AC
  Filled 2017-03-04: qty 2

## 2017-03-04 MED ORDER — OXYTOCIN 40 UNITS IN LACTATED RINGERS INFUSION - SIMPLE MED: 2.5000 [IU]/h | INTRAVENOUS | Status: AC

## 2017-03-04 MED ORDER — PHENYLEPHRINE 8 MG IN D5W 100 ML (0.08MG/ML) PREMIX OPTIME
INJECTION | INTRAVENOUS | Status: DC | PRN
  Administered 2017-03-04: 80 ug/min via INTRAVENOUS
  Administered 2017-03-04: 60 ug/min via INTRAVENOUS

## 2017-03-04 MED ORDER — PHENYLEPHRINE 8 MG IN D5W 100 ML (0.08MG/ML) PREMIX OPTIME
INJECTION | INTRAVENOUS | Status: AC
  Filled 2017-03-04: qty 100

## 2017-03-04 MED ORDER — MEPERIDINE HCL 25 MG/ML IJ SOLN
INTRAMUSCULAR | Status: DC | PRN
  Administered 2017-03-04 (×2): 12.5 mg via INTRAVENOUS

## 2017-03-04 MED ORDER — OXYTOCIN 10 UNIT/ML IJ SOLN
INTRAMUSCULAR | Status: AC
  Filled 2017-03-04: qty 4

## 2017-03-04 MED ORDER — MEPERIDINE HCL 25 MG/ML IJ SOLN
INTRAMUSCULAR | Status: AC
  Filled 2017-03-04: qty 1

## 2017-03-04 MED ORDER — FENTANYL CITRATE (PF) 100 MCG/2ML IJ SOLN
INTRAMUSCULAR | Status: DC | PRN
  Administered 2017-03-04: 25 ug via INTRAVENOUS

## 2017-03-04 MED ORDER — FAMOTIDINE 20 MG PO TABS
20.0000 mg | ORAL_TABLET | Freq: Once | ORAL | Status: AC | PRN
  Administered 2017-03-04: 20 mg via ORAL
  Filled 2017-03-04: qty 1

## 2017-03-04 MED ORDER — LACTATED RINGERS IV SOLN
INTRAVENOUS | Status: DC
  Administered 2017-03-04 (×3): via INTRAVENOUS

## 2017-03-04 MED ORDER — MORPHINE SULFATE (PF) 0.5 MG/ML IJ SOLN
INTRAMUSCULAR | Status: DC | PRN
  Administered 2017-03-04: .1 mg via EPIDURAL

## 2017-03-04 MED ORDER — IBUPROFEN 600 MG PO TABS
600.0000 mg | ORAL_TABLET | Freq: Four times a day (QID) | ORAL | Status: DC
  Administered 2017-03-04 – 2017-03-06 (×8): 600 mg via ORAL
  Filled 2017-03-04 (×8): qty 1

## 2017-03-04 MED ORDER — MENTHOL 3 MG MT LOZG: 1.0000 | LOZENGE | OROMUCOSAL | Status: DC | PRN

## 2017-03-04 MED ORDER — METOCLOPRAMIDE HCL 10 MG PO TABS
10.0000 mg | ORAL_TABLET | Freq: Once | ORAL | Status: AC | PRN
  Administered 2017-03-04: 10 mg via ORAL
  Filled 2017-03-04: qty 1

## 2017-03-04 MED ORDER — SENNOSIDES-DOCUSATE SODIUM 8.6-50 MG PO TABS
2.0000 | ORAL_TABLET | ORAL | Status: DC
  Administered 2017-03-04 – 2017-03-05 (×2): 2 via ORAL
  Filled 2017-03-04 (×2): qty 2

## 2017-03-04 MED ORDER — ZOLPIDEM TARTRATE 5 MG PO TABS: 5.0000 mg | ORAL_TABLET | Freq: Every evening | ORAL | Status: DC | PRN

## 2017-03-04 MED ORDER — LACTATED RINGERS IV SOLN
INTRAVENOUS | Status: DC
  Administered 2017-03-04: 20:00:00 via INTRAVENOUS

## 2017-03-04 MED ORDER — CEFAZOLIN SODIUM-DEXTROSE 2-4 GM/100ML-% IV SOLN
2.0000 g | INTRAVENOUS | Status: AC
  Administered 2017-03-04: 2 g via INTRAVENOUS
  Filled 2017-03-04: qty 100

## 2017-03-04 MED ORDER — DIBUCAINE 1 % RE OINT: 1.0000 "application " | TOPICAL_OINTMENT | RECTAL | Status: DC | PRN

## 2017-03-04 MED ORDER — ACETAMINOPHEN 160 MG/5ML PO SOLN
975.0000 mg | Freq: Once | ORAL | Status: AC
  Administered 2017-03-04: 975 mg via ORAL
  Filled 2017-03-04: qty 40.6

## 2017-03-04 MED ORDER — SCOPOLAMINE 1 MG/3DAYS TD PT72
1.0000 | MEDICATED_PATCH | Freq: Once | TRANSDERMAL | Status: DC | PRN
  Administered 2017-03-04: 1.5 mg via TRANSDERMAL
  Filled 2017-03-04: qty 1

## 2017-03-04 MED ORDER — ONDANSETRON HCL 4 MG/2ML IJ SOLN
INTRAMUSCULAR | Status: AC
  Filled 2017-03-04: qty 2

## 2017-03-04 MED ORDER — OXYCODONE HCL 5 MG PO TABS: 10.0000 mg | ORAL_TABLET | ORAL | Status: DC | PRN

## 2017-03-04 MED ORDER — LACTATED RINGERS IV SOLN
INTRAVENOUS | Status: DC | PRN
  Administered 2017-03-04: 40 [IU] via INTRAVENOUS

## 2017-03-04 MED ORDER — WITCH HAZEL-GLYCERIN EX PADS: 1.0000 "application " | MEDICATED_PAD | CUTANEOUS | Status: DC | PRN

## 2017-03-04 SURGICAL SUPPLY — 36 items
BENZOIN TINCTURE PRP APPL 2/3 (GAUZE/BANDAGES/DRESSINGS) ×3 IMPLANT
CHLORAPREP W/TINT 26ML (MISCELLANEOUS) ×3 IMPLANT
CLAMP CORD UMBIL (MISCELLANEOUS) IMPLANT
CLOSURE STERI STRIP 1/2 X4 (GAUZE/BANDAGES/DRESSINGS) ×3 IMPLANT
CLOSURE WOUND 1/2 X4 (GAUZE/BANDAGES/DRESSINGS)
CLOTH BEACON ORANGE TIMEOUT ST (SAFETY) ×3 IMPLANT
CONTAINER PREFILL 10% NBF 15ML (MISCELLANEOUS) IMPLANT
DERMABOND ADVANCED (GAUZE/BANDAGES/DRESSINGS)
DERMABOND ADVANCED .7 DNX12 (GAUZE/BANDAGES/DRESSINGS) IMPLANT
DRSG OPSITE POSTOP 4X10 (GAUZE/BANDAGES/DRESSINGS) ×3 IMPLANT
ELECT REM PT RETURN 9FT ADLT (ELECTROSURGICAL) ×3
ELECTRODE REM PT RTRN 9FT ADLT (ELECTROSURGICAL) ×1 IMPLANT
EXTRACTOR VACUUM M CUP 4 TUBE (SUCTIONS) ×2 IMPLANT
EXTRACTOR VACUUM M CUP 4' TUBE (SUCTIONS) ×1
GLOVE BIO SURGEON STRL SZ8 (GLOVE) ×3 IMPLANT
GLOVE BIOGEL PI IND STRL 7.0 (GLOVE) ×1 IMPLANT
GLOVE BIOGEL PI INDICATOR 7.0 (GLOVE) ×2
GOWN STRL REUS W/TWL LRG LVL3 (GOWN DISPOSABLE) ×6 IMPLANT
KIT ABG SYR 3ML LUER SLIP (SYRINGE) ×3 IMPLANT
NEEDLE HYPO 25X5/8 SAFETYGLIDE (NEEDLE) ×3 IMPLANT
NS IRRIG 1000ML POUR BTL (IV SOLUTION) ×3 IMPLANT
PACK C SECTION WH (CUSTOM PROCEDURE TRAY) ×3 IMPLANT
PAD OB MATERNITY 4.3X12.25 (PERSONAL CARE ITEMS) ×3 IMPLANT
PENCIL SMOKE EVAC W/HOLSTER (ELECTROSURGICAL) ×3 IMPLANT
STRIP CLOSURE SKIN 1/2X4 (GAUZE/BANDAGES/DRESSINGS) IMPLANT
SUT MNCRL 0 VIOLET CTX 36 (SUTURE) ×4 IMPLANT
SUT MONOCRYL 0 CTX 36 (SUTURE) ×8
SUT PDS AB 0 CTX 60 (SUTURE) ×3 IMPLANT
SUT PLAIN 0 NONE (SUTURE) IMPLANT
SUT PLAIN 2 0 (SUTURE)
SUT PLAIN 2 0 XLH (SUTURE) ×3 IMPLANT
SUT PLAIN ABS 2-0 CT1 27XMFL (SUTURE) IMPLANT
SUT VIC AB 4-0 KS 27 (SUTURE) ×3 IMPLANT
TOWEL OR 17X24 6PK STRL BLUE (TOWEL DISPOSABLE) ×3 IMPLANT
TRAY FOLEY BAG SILVER LF 14FR (SET/KITS/TRAYS/PACK) ×3 IMPLANT
WATER STERILE IRR 1000ML POUR (IV SOLUTION) ×3 IMPLANT

## 2017-03-04 NOTE — Anesthesia Preprocedure Evaluation (Signed)
Anesthesia Evaluation  Patient identified by MRN, date of birth, ID band Patient awake    Reviewed: Allergy & Precautions, H&P , Patient's Chart, lab work & pertinent test results  Airway Mallampati: II  TM Distance: >3 FB Neck ROM: full    Dental no notable dental hx.    Pulmonary    Pulmonary exam normal breath sounds clear to auscultation       Cardiovascular Exercise Tolerance: Good  Rhythm:regular Rate:Normal     Neuro/Psych    GI/Hepatic   Endo/Other    Renal/GU      Musculoskeletal   Abdominal   Peds  Hematology   Anesthesia Other Findings   Reproductive/Obstetrics                             Anesthesia Physical Anesthesia Plan  ASA: II  Anesthesia Plan: Spinal   Post-op Pain Management:    Induction:   PONV Risk Score and Plan: 2 and Ondansetron, Dexamethasone and Scopolamine patch - Pre-op  Airway Management Planned:   Additional Equipment:   Intra-op Plan:   Post-operative Plan:   Informed Consent: I have reviewed the patients History and Physical, chart, labs and discussed the procedure including the risks, benefits and alternatives for the proposed anesthesia with the patient or authorized representative who has indicated his/her understanding and acceptance.     Plan Discussed with:   Anesthesia Plan Comments: (  )        Anesthesia Quick Evaluation

## 2017-03-04 NOTE — Transfer of Care (Signed)
Immediate Anesthesia Transfer of Care Note  Patient: Cynthia [name]  Procedure(s) Performed: Procedure(s) with comments: REPEAT CESAREAN SECTION (N/A) - Repeat edc 03/09/17 NKDA Tracey RNFA  Patient Location: PACU  Anesthesia Type:Spinal  Level of Consciousness: awake and patient cooperative  Airway & Oxygen Therapy: Patient Spontanous Breathing  Post-op Assessment: Report given to RN and Post -op Vital signs reviewed and stable  Post vital signs: Reviewed and stable  Last Vitals:  Vitals:   03/04/17 1133 03/04/17 1147  BP: 119/60   Pulse: (!) 113   Resp: 18   Temp: 36.8 C 36.9 C    Last Pain:  Vitals:   03/04/17 1147  TempSrc: Oral      Patients Stated Pain Goal: 4 (03/04/17 1147)  Complications: No apparent anesthesia complications

## 2017-03-04 NOTE — Anesthesia Procedure Notes (Signed)
Spinal  Patient location during procedure: OR Staffing Anesthesiologist: Joscelyne Renville Spinal Block Patient position: sitting Prep: DuraPrep Patient monitoring: heart rate, blood pressure and continuous pulse ox Approach: right paramedian Location: L3-4 Injection technique: single-shot Needle Needle type: Sprotte  Needle gauge: 24 G Needle length: 9 cm Assessment Sensory level: T4 Additional Notes Spinal Dosage in OR  .75% Bupivicaine ml       1.2     [id number]   mcg        100    Fentanyl mcg            25      

## 2017-03-04 NOTE — Brief Op Note (Signed)
03/04/2017  1:49 PM  PATIENT:  Cynthia [name]  31 y.o. female  PRE-OPERATIVE DIAGNOSIS:  PREVIOUS x 1  POST-OPERATIVE DIAGNOSIS:  PREVIOUS x 1  PROCEDURE:  Procedure(s) with comments: REPEAT CESAREAN SECTION (N/A) - Repeat edc 03/09/17 NKDA Tracey RNFA  SURGEON:  Surgeon(s) and Role:    * Harold Hedgeomblin, Raciel Caffrey, MD - Primary  PHYSICIAN ASSISTANT:   ASSISTANTS: Genice Rougeracey Tucker RNFA    ANESTHESIA:   spinal  EBL:  Total I/O In: -  Out: 200 [Blood:200]  BLOOD ADMINISTERED:none  DRAINS: Urinary Catheter (Foley)   LOCAL MEDICATIONS USED:  NONE  SPECIMEN:  No Specimen  DISPOSITION OF SPECIMEN:  N/A  COUNTS:  YES  TOURNIQUET:  * No tourniquets in log *  DICTATION: .Other Dictation: Dictation Number [id number]  PLAN OF CARE: Admit to inpatient   PATIENT DISPOSITION:  PACU - hemodynamically stable.   Delay start of Pharmacological VTE agent (>24hrs) due to surgical blood loss or risk of bleeding: not applicable

## 2017-03-04 NOTE — Anesthesia Postprocedure Evaluation (Signed)
Anesthesia Post Note  Patient: Cynthia [name]  Procedure(s) Performed: Procedure(s) (LRB): REPEAT CESAREAN SECTION (N/A)     Patient location during evaluation: PACU Anesthesia Type: Spinal Level of consciousness: awake Pain management: satisfactory to patient Vital Signs Assessment: post-procedure vital signs reviewed and stable Respiratory status: spontaneous breathing Cardiovascular status: blood pressure returned to baseline Postop Assessment: no headache and spinal receding Anesthetic complications: no    Last Vitals:  Vitals:   03/04/17 1515 03/04/17 1527  BP: 107/66 (!) 100/52  Pulse: 87 91  Resp: (!) 21 20  Temp:  36.6 C  SpO2: 96% 95%    Last Pain:  Vitals:   03/04/17 1527  TempSrc: Axillary   Pain Goal: Patients Stated Pain Goal: 4 (03/04/17 1147)               Cristela BlueJACKSON,Donald Jacque EDWARD

## 2017-03-05 LAB — CBC
HEMATOCRIT: 30.8 % — AB (ref 36.0–46.0)
HEMOGLOBIN: 10.5 g/dL — AB (ref 12.0–15.0)
MCH: 30.4 pg (ref 26.0–34.0)
MCHC: 34.1 g/dL (ref 30.0–36.0)
MCV: 89.3 fL (ref 78.0–100.0)
Platelets: 148 10*3/uL — ABNORMAL LOW (ref 150–400)
RBC: 3.45 MIL/uL — AB (ref 3.87–5.11)
RDW: 15 % (ref 11.5–15.5)
WBC: 8.3 10*3/uL (ref 4.0–10.5)

## 2017-03-05 LAB — RPR: RPR: NONREACTIVE

## 2017-03-05 LAB — BIRTH TISSUE RECOVERY COLLECTION (PLACENTA DONATION)

## 2017-03-05 NOTE — Progress Notes (Signed)
Subjective: Postpartum Day 1: Cesarean Delivery Patient reports tolerating PO.    Objective: Vital signs in last 24 hours: Temp:  [97.3 F (36.3 C)-98.5 F (36.9 C)] 97.3 F (36.3 C) (09/11 0800) Pulse Rate:  [62-101] 76 (09/11 0800) Resp:  [14-25] 18 (09/11 0800) BP: (92-114)/(44-72) 97/44 (09/11 0800) SpO2:  [94 %-100 %] 99 % (09/11 0800)  Physical Exam:  General: alert Lochia: appropriate Uterine Fundus: firm Incision: healing well DVT Evaluation: No evidence of DVT seen on physical exam.   Recent Labs  03/04/17 1144 03/05/17 0700  HGB 13.3 10.5*  HCT 39.5 30.8*    Assessment/Plan: Status post Cesarean section. Doing well postoperatively.  Continue current care.  Cynthia [name],Cynthia [name] 03/05/2017, 11:33 AM

## 2017-03-05 NOTE — Op Note (Signed)
NAME:  Cynthia [name], Cynthia [name]        CHAYA, DEHAANCCOUNT NO.:  MEDICAL RECORD NO.:  [id number]  LOCATION:                                 FACILITY:  PHYSICIAN:  Guy Sandifer. Henderson Cloud, M.D.      DATE OF BIRTH:  DATE OF PROCEDURE:  03/04/2017 DATE OF DISCHARGE:                              OPERATIVE REPORT   PREOPERATIVE DIAGNOSIS:  Previous cesarean section, desires repeat.  POSTOPERATIVE DIAGNOSIS:  Previous cesarean section, desires repeat.  PROCEDURE:  Repeat low transverse cesarean section.  SURGEON:  Guy Sandifer. Henderson Cloud, M.D.  ANESTHESIA:  Belva Agee, M.D., spinal.  ESTIMATED BLOOD LOSS:  450 mL.  FINDINGS:  Viable female infant.  Apgars, arterial cord pH, birth weight pending.  SPECIMENS:  None.  INDICATIONS AND CONSENT:  This patient is a 31 year old, G2, P1, at 69- 2/7th weeks.  Prenatal care complicated by fetal pelvic kidney with adequate growth on Maternal-Fetal Medicine ultrasound.  The patient desires repeat cesarean section.  Potential risks and complications were reviewed preoperatively including, but not limited to, infection, organ damage, bleeding requiring transfusion of blood products with HIV and hepatitis acquisition, DVT, PE, pneumonia.  All questions were answered and consent was signed on the chart.  DESCRIPTION OF PROCEDURE:  The patient was taken to the operating room. She was identified.  Spinal anesthetic was placed and she was placed in dorsal supine position with a 15-degree left lateral wedge.  She was prepped vaginally and vulva.  Foley catheter was placed and prepped abdominally.  After a 3-minute drying time, she was draped in sterile fashion.  Time-out was undertaken.  After testing for adequate spinal anesthesia, skin was entered through a Pfannenstiel incision and the old scar was taken out on the way in.  Dissection was carried out in layers to the peritoneum which was incised and extended superiorly and inferiorly.  Vesicouterine peritoneum  was taken down cephalad laterally. Bladder flap developed and the bladder blade was placed.  Uterus was incised in a low transverse manner and the uterine cavity was entered bluntly.  Uterine incision was extended cephalad laterally with fingers. Artificial rupture of membranes for clear fluid was carried out.  Vertex was delivered through the incision with a vacuum extractor with no pop offs without difficulty.  Single nuchal cord x1 was reduced.  Baby was then delivered.  Good cry and tone were noted.  After 1 minute, cord was clamped and cut.  Baby was handed to the waiting pediatrics team. Placenta was manually delivered.  Uterine cavity was clean.  Uterus was closed in 2 running locking imbricating layers of 0 Monocryl suture which achieves good hemostasis.  Anterior peritoneum was closed in a running fashion with 0 Monocryl suture which was also used to reapproximate the pyramidalis muscle in the midline.  Anterior rectus fascia was closed in a running fashion with a 0 looped PDS suture. Subcutaneous layer was closed with interrupted plain and the skin was closed in subcuticular fashion with 4-0 Vicryl on a Keith needle.  Benzoin and Steri-Strips were applied.  All counts were correct.  The patient was taken to the recovery room in stable condition.  Guy SandiferJames E. Henderson Cloudomblin, M.D.     JET/MEDQ  D:  03/04/2017  T:  03/05/2017  Job:  [id number]

## 2017-03-06 ENCOUNTER — Encounter (HOSPITAL_COMMUNITY): Payer: Self-pay

## 2017-03-06 MED ORDER — OXYCODONE HCL 5 MG PO TABS: 5.0000 mg | ORAL_TABLET | Freq: Four times a day (QID) | ORAL | 0 refills | Status: DC | PRN

## 2017-03-06 MED ORDER — IBUPROFEN 600 MG PO TABS: 600.0000 mg | ORAL_TABLET | Freq: Four times a day (QID) | ORAL | 0 refills | Status: DC | PRN

## 2017-03-06 MED ORDER — ACETAMINOPHEN 325 MG PO TABS: 650.0000 mg | ORAL_TABLET | Freq: Four times a day (QID) | ORAL | 0 refills | Status: DC | PRN

## 2017-03-06 NOTE — Lactation Note (Signed)
This note was copied from a baby's chart. Lactation Consultation Note  Patient Name: Cynthia [name] Reason for consult: Follow-up assessment;Infant weight loss;MD order;Nipple pain/trauma (4% weight loss, Bili for 35 hours 7.4 )  Baby is 48 hours old, per mom baby recently breast fed at 1151 for 19 mins.  Baby has been consistent with breast feeding, and per mom experiencing some soreness , the comfort gels helping.  LC reviewed steps for latching and basics, LC assessed breast tissue with moms permission and noted the nipples to Be pinky red and had mom compress areolas and noted them to be semi - compressible ( some edema , with excellent flow  When mom hands expresses). LC encouraged to use her EBM to nipples liberally, comfort gels after feedings , when warm rinse with warm water, and apply a dab of coconut oil to nipples and areolas and wear shells ( as instructed except when sleeping ). Sore nipple and engorgement prevention and tx reviewed. Per mom will have a DEBP Medela at home and also a Spectra DEBP . LC Instructed mom on the use shells , hand pump .  Mother informed of post-discharge support and given phone number to the lactation department, including services for phone call assistance; out-patient appointments; and breastfeeding support group. List of other breastfeeding resources in the community given in the handout. Encouraged mother to call for problems or concerns related to breastfeeding.   Maternal Data Has patient been taught Hand Expression?: Yes (reviewed )  Feeding Feeding Type:  (baby recently breast fed ) Length of feed: 19 min (per mom )  LATCH Score ( [facility] )  Latch: Grasps breast easily, tongue down, lips flanged, rhythmical sucking.  Audible Swallowing: A few with stimulation  Type of Nipple: Flat  Comfort (Breast/Nipple): Filling, red/small blisters or bruises, mild/mod discomfort  Hold (Positioning): No assistance  needed to correctly position infant at breast.  LATCH Score: 7  Interventions Interventions: Breast feeding basics reviewed;Shells;Comfort gels;Hand pump  Lactation Tools Discussed/Used Tools: Shells;Pump;Comfort gels Shell Type: Inverted Breast pump type: Manual WIC Program: No Pump Review: Setup, frequency, and cleaning Initiated by:: MAI  Date initiated:: 03/06/17   Consult Status Consult Status: Complete Date: 03/06/17    Cynthia [name] [name], 1:30 PM

## 2017-03-06 NOTE — Lactation Note (Signed)
This note was copied from a baby's chart. Lactation Consultation Note Experienced BF mom BF her now 8120 month old for 1 yr. Mom states this baby is BF well, excepts that now is starting to get a shallow latch d/t she is getting sore. Suggested chin tug, STS (mom was feeding baby wrapped in blankets), football position, and using support during BF.  Mom has short shaft compressible areolas and nipples. Noted to Rt. Nipple 2 blisters. Gave comfort gels w/verbal care instructions. Baby had circumcision during the day, mom stated he was sleepy afterwards.  Discussed baby I&O, baby has had several stools, and now has had 2 voids that mom can tell. Encouraged to cont. To document I&O.  Mom states she has visible colostrum.  Encouraged mom to call for assistance in obtaining deeper latch if needed. [facility]/LC brochure given w/resources, support groups and LC services.  Patient Name: Cynthia [name] [id number]'s Date: 03/06/2017 Reason for consult: Initial assessment   Maternal Data Has patient been taught Hand Expression?: Yes Does the patient have breastfeeding experience prior to this delivery?: Yes  Feeding    LATCH Score       Type of Nipple: Everted at rest and after stimulation  Comfort (Breast/Nipple): Filling, red/small blisters or bruises, mild/mod discomfort        Interventions Interventions: Support pillows;Breast feeding basics reviewed;Position options;Skin to skin;Comfort gels  Lactation Tools Discussed/Used Tools: Comfort gels   Consult Status Consult Status: Follow-up Date: 03/07/17 Follow-up type: In-patient    Charyl DancerCARVER, Jakie Debow G 03/06/2017, 12:23 AM

## 2017-03-06 NOTE — Discharge Summary (Signed)
Obstetric Discharge Summary Reason for Admission: cesarean section Prenatal Procedures: none Intrapartum Procedures: cesarean: low cervical, transverse Postpartum Procedures: none Complications-Operative and Postpartum: none Hemoglobin  Date Value Ref Range Status  03/05/2017 10.5 (L) 12.0 - 15.0 g/dL Final    Comment:    REPEATED TO VERIFY DELTA CHECK NOTED    HCT  Date Value Ref Range Status  03/05/2017 30.8 (L) 36.0 - 46.0 % Final    Physical Exam:  General: alert and no distress Lochia: appropriate Uterine Fundus: firm Incision: healing well DVT Evaluation: No evidence of DVT seen on physical exam.  Discharge Diagnoses: Term Pregnancy-delivered  Discharge Information: Date: 03/06/2017 Activity: pelvic rest Diet: routine Medications: PNV, Ibuprofen and oxycodone Condition: stable Instructions: refer to practice specific booklet Discharge to: home   Newborn Data: Live born female  Birth Weight: 8 lb 3.8 oz (3735 g) APGAR: 8, 9  Home with mother.  Cynthia [name],Cynthia [name] 03/06/2017, 7:47 AM

## 2018-03-06 IMAGING — US US MFM OB FOLLOW-UP
1 series · 13 of 28 positions shown · non-contrast
Comparison: none

[address]
[facility] [HOSPITAL]

Indications
31 weeks gestation of pregnancy
Fetal abnormality - other known or
suspected (pelvic kidney)
History of cesarean delivery, currently
pregnant
Encounter for other antenatal screening
follow-up
OB History
Blood Type:            Height:  5'1"   Weight (lb):  150      BMI:
Gravidity:    2         Term:   1
Living:       1
Fetal Evaluation
Num Of Fetuses:     1
Fetal Heart         128
Rate(bpm):
Cardiac Activity:   Observed
Presentation:       Cephalic
Placenta:           Anterior, above cervical os
P. Cord Insertion:  Previously Visualized
Amniotic Fluid
AFI FV:      Subjectively within normal limits
AFI Sum(cm)     %Tile       Largest Pocket(cm)
14.54           51
RUQ(cm)       RLQ(cm)       LUQ(cm)        LLQ(cm)
6.24
Biometry
BPD:      84.3  mm     G. Age:  33w 6d         96  %    CI:        76.75   %   70 - 86
FL/HC:      20.0   %   19.3 -
HC:      304.8  mm     G. Age:  33w 6d         79  %    HC/AC:      1.01       0.96 -
AC:      300.5  mm     G. Age:  34w 0d       > 97  %    FL/BPD:     72.5   %   71 - 87
FL:       61.1  mm     G. Age:  31w 5d         45  %    FL/AC:      20.3   %   20 - 24
HUM:      54.5  mm     G. Age:  31w 5d         57  %
CER:      39.3  mm     G. Age:  33w 2d         79  %
CM:        8.4  mm
Est. FW:    6216  gm    4 lb 13 oz      83  %
Gestational Age
LMP:           32w 2d       Date:   05/27/16                 EDD:   03/03/17
U/S Today:     33w 3d                                        EDD:   02/23/17
Best:          31w 3d    Det. By:   Early Ultrasound         EDD:   03/09/17
(07/30/16)
Anatomy
Cranium:               Appears normal         LVOT:                   Appears normal
Cavum:                 Appears normal         Aortic Arch:            Appears normal
Ventricles:            Appears normal         Ductal Arch:            Appears normal
Choroid Plexus:        Previously seen        Diaphragm:              Appears normal
Cerebellum:            Appears normal         Stomach:                Appears normal, left
sided
Posterior Fossa:       Previously seen        Abdomen:                Previously seen
Nuchal Fold:           Not applicable (>20    Abdominal Wall:         Previously seen
wks GA)
Face:                  Orbits and profile     Cord Vessels:           Previously seen
previously seen
Lips:                  Previously seen        Kidneys:                Pelvic kidney Left
Palate:                Appears normal         Bladder:                Appears normal
Thoracic:              Previously seen        Spine:                  Previously seen
Heart:                 Appears normal         Upper Extremities:      Previously seen:
(4CH, axis, and situs                          5th digit nws
RVOT:                  Appears normal         Lower Extremities:      Previously seen
Other:  Male gender previously seen. Heels previously seen.  Technically
difficult due to fetal position.
Cervix Uterus Adnexa
Cervix
Not visualized (advanced GA >17wks)
Uterus
No abnormality visualized.
Left Ovary
Size(cm)     3.32  x    2.56   x  1.43      Vol(ml):
Within normal limits. No adnexal mass visualized.
Right Ovary
Size(cm)     3.13  x    1.57   x  2.73      Vol(ml): 7
Cul De Sac:   No free fluid seen.
Adnexa:       No abnormality visualized.
Impression
INDICATION: 31 Keid[address] Millonarios XWONNNN at 30w3d with fetus with left
pelvic kidney for fetal ultrasound.

[Series 1: us mfm ob follow-up · 96 acquisitions, 13 frames shown]
[im 4/96]
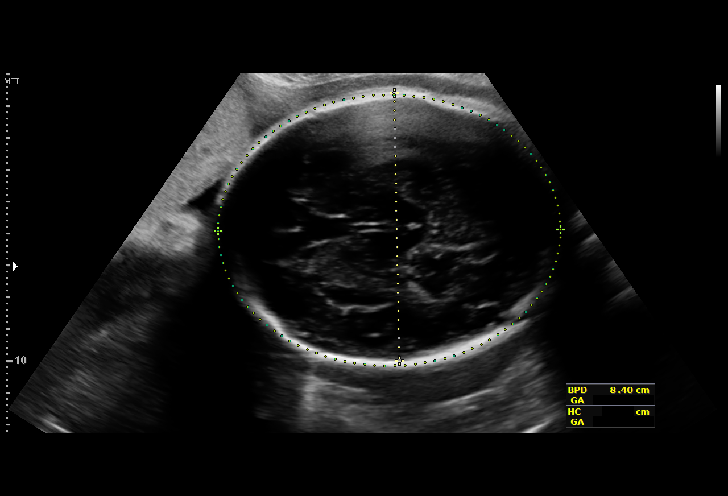
[im 11/96]
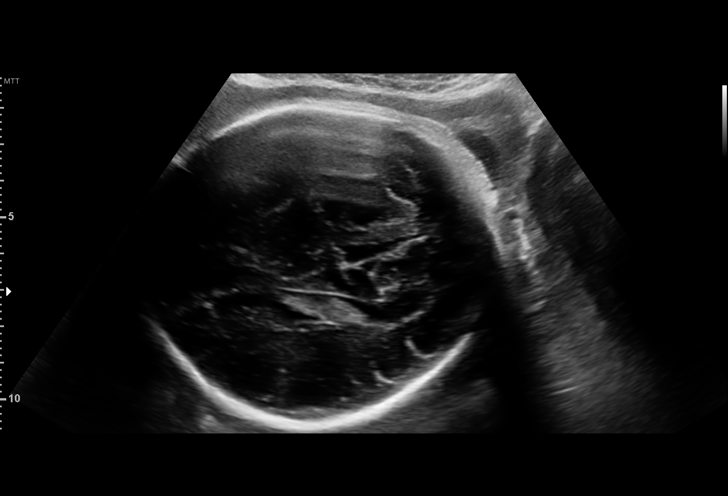
[im 18/96]
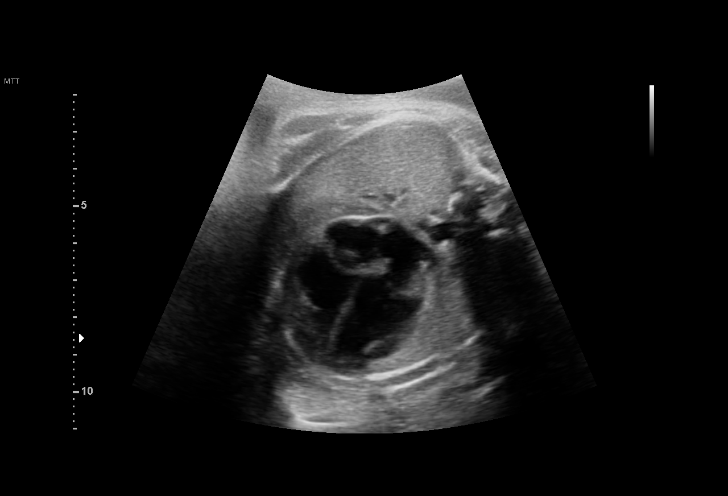
[im 25/96]
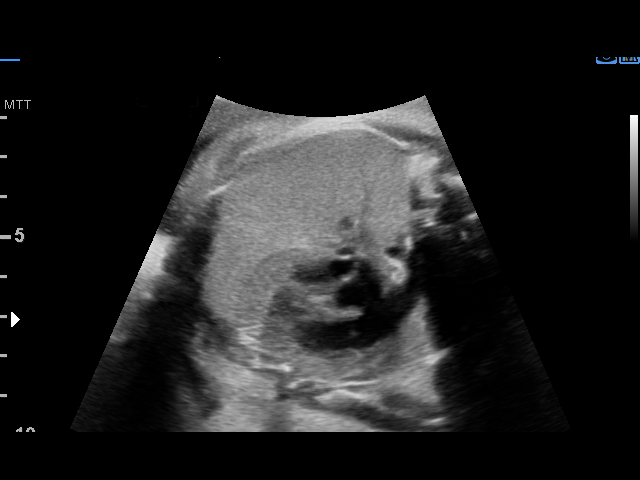
[im 32/96]
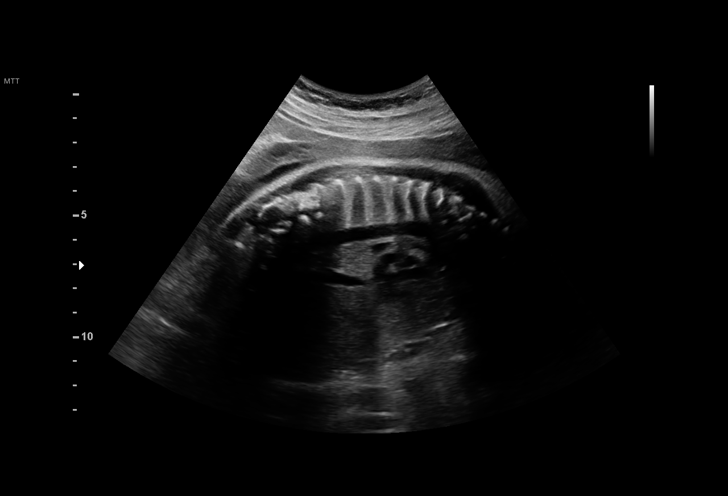
[im 39/96]
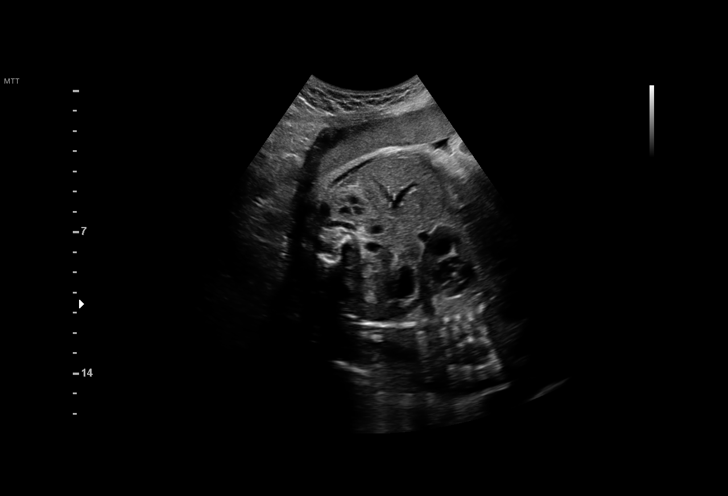
[im 50/96]
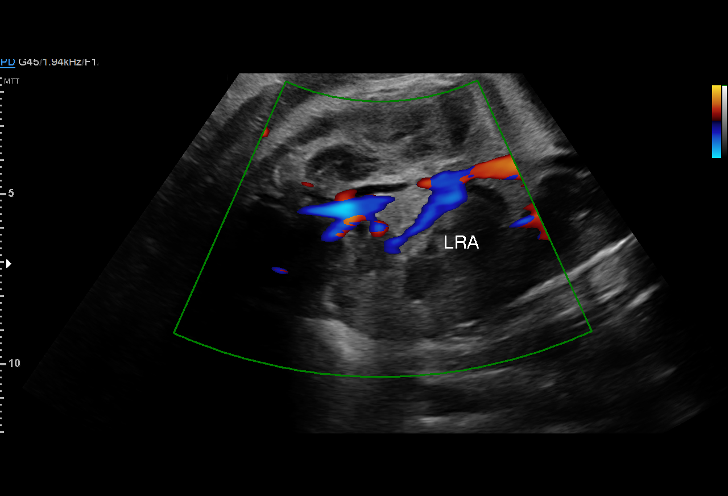
[im 57/96]
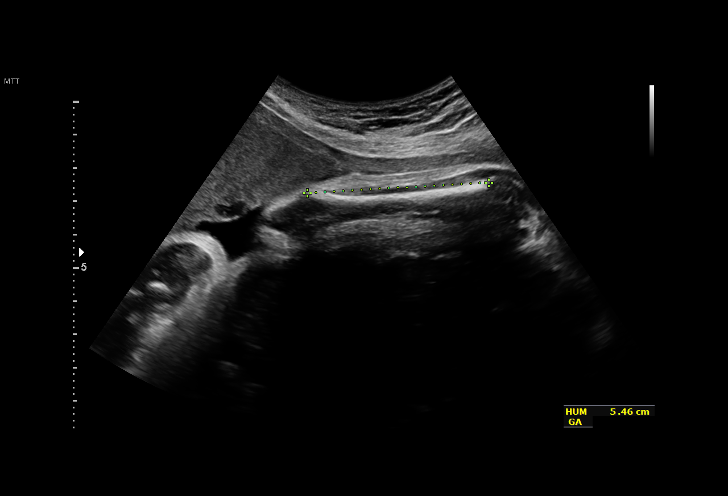
[im 64/96]
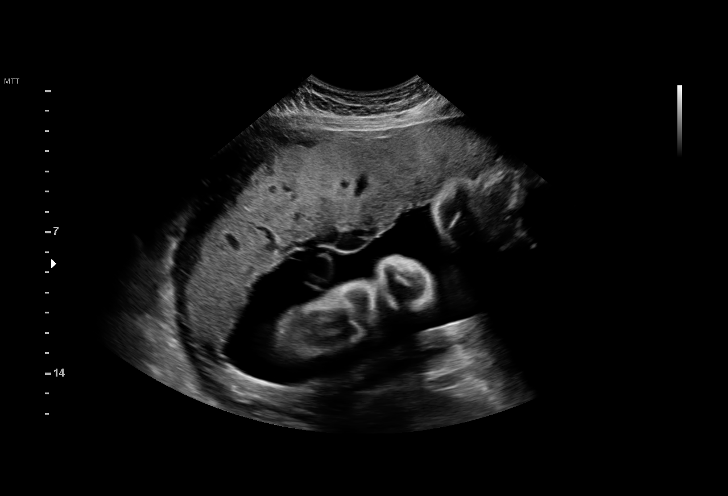
[im 71/96]
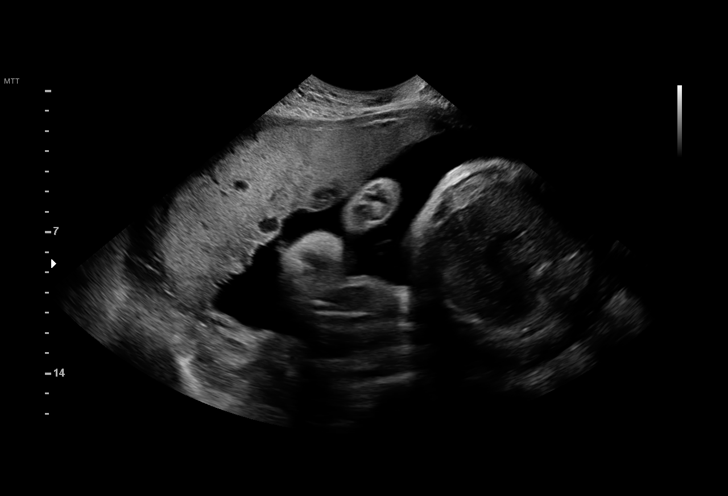
[im 78/96]
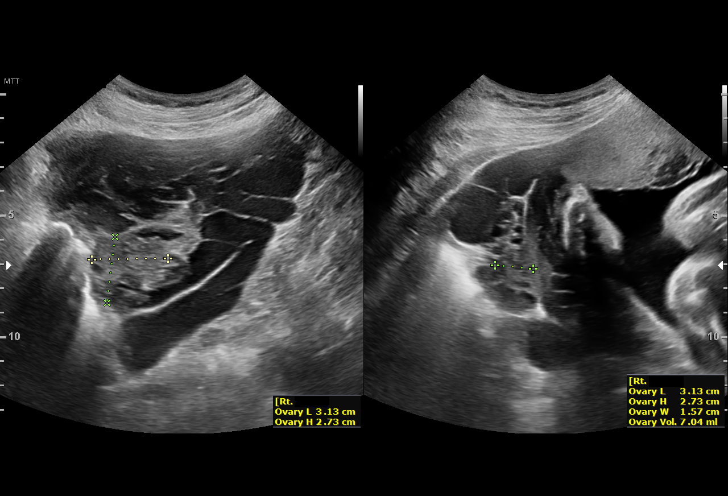
[im 85/96]
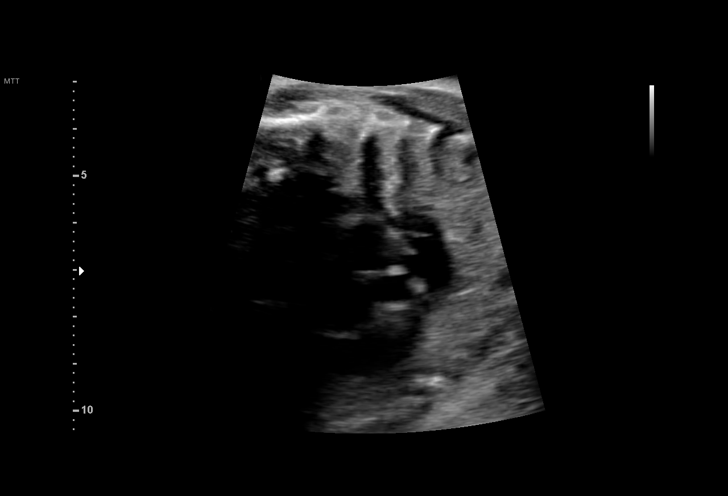
[im 92/96]
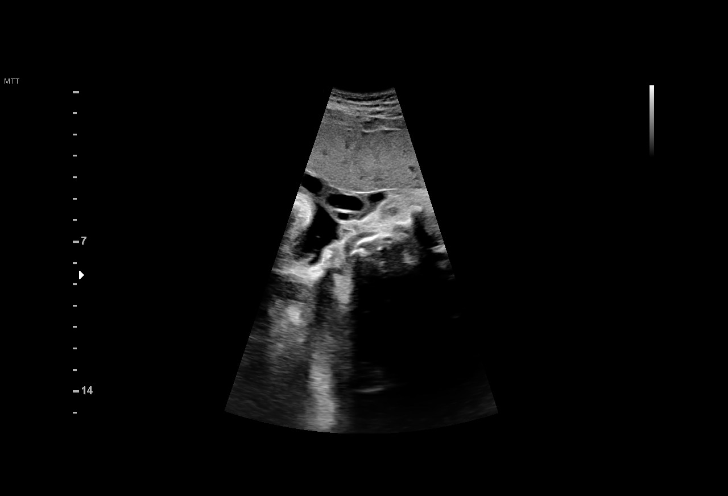

[13 of 28 positions shown; findings below may reference images not displayed]

FINDINGS: 1. Single intrauterine pregnancy.
2. Estimated fetal weight is in the 83rd%.
3. Anterior placenta without evidence of previa.
4. Normal amniotic fluid index.
5. Again seen is a left pelvic kidney.
6. The remainder of the limited anatomy survey is normal;
any anatomy not evaluated on today's exam was evaluated
on the previous exam.
Recommendations

1. Appropriate fetal growth.
2. Fetal anatomic survey is complete.
3. Fetal left pelvic kidney:
- previously counseled
- declines antenatal Pediatric Urology consult- will follow up
with them postnatally
- inform Pediatrics at delivery
4. Follow up in 6 weeks

## 2019-07-02 ENCOUNTER — Ambulatory Visit: Payer: 59 | Attending: Internal Medicine

## 2019-07-02 DIAGNOSIS — Z20822 Contact with and (suspected) exposure to covid-19: Secondary | ICD-10-CM

## 2019-07-04 LAB — NOVEL CORONAVIRUS, NAA: SARS-CoV-2, NAA: NOT DETECTED

## 2020-06-21 ENCOUNTER — Encounter (HOSPITAL_COMMUNITY): Payer: Self-pay

## 2020-06-21 ENCOUNTER — Encounter (HOSPITAL_COMMUNITY): Payer: Self-pay | Admitting: *Deleted

## 2020-06-21 NOTE — Patient Instructions (Signed)
Cynthia [name]  06/21/2020   Your procedure is scheduled on:  07/04/2020  Arrive at 0530 at [employer] [facility] on [address] at [facility]  and [address]. You are invited to use the FREE valet parking or use the Visitor's parking deck.  Pick up the phone at the desk and dial [phone].  Call this number if you have problems the morning of surgery: [phone]  Remember:   Do not eat food:(After Midnight) Desps de medianoche.  Do not drink clear liquids: (After Midnight) Desps de medianoche.  Take these medicines the morning of surgery with A SIP OF WATER:  none   Do not wear jewelry, make-up or nail polish.  Do not wear lotions, powders, or perfumes. Do not wear deodorant.  Do not shave 48 hours prior to surgery.  Do not bring valuables to the hospital.  [facility] is not   responsible for any belongings or valuables brought to the hospital.  Contacts, dentures or bridgework may not be worn into surgery.  Leave suitcase in the car. After surgery it may be brought to your room.  For patients admitted to the hospital, checkout time is 11:00 AM the day of              discharge.      Please read over the following fact sheets that you were given:     Preparing for Surgery

## 2020-07-01 ENCOUNTER — Encounter (HOSPITAL_COMMUNITY): Payer: Self-pay | Admitting: Obstetrics and Gynecology

## 2020-07-01 ENCOUNTER — Encounter (HOSPITAL_COMMUNITY): Payer: Self-pay

## 2020-07-01 ENCOUNTER — Other Ambulatory Visit (HOSPITAL_COMMUNITY)
Admission: RE | Admit: 2020-07-01 | Discharge: 2020-07-01 | Disposition: A | Discharge status: Home / Self Care | Payer: 59 | Source: Ambulatory Visit | Attending: Obstetrics and Gynecology | Admitting: Obstetrics and Gynecology

## 2020-07-01 ENCOUNTER — Encounter (HOSPITAL_COMMUNITY)
Admission: RE | Admit: 2020-07-01 | Discharge: 2020-07-01 | Disposition: A | Discharge status: Home / Self Care | Payer: 59 | Source: Ambulatory Visit | Attending: Obstetrics and Gynecology | Admitting: Obstetrics and Gynecology

## 2020-07-01 DIAGNOSIS — Z20822 Contact with and (suspected) exposure to covid-19: Secondary | ICD-10-CM | POA: Insufficient documentation

## 2020-07-01 DIAGNOSIS — Z01812 Encounter for preprocedural laboratory examination: Secondary | ICD-10-CM | POA: Insufficient documentation

## 2020-07-01 LAB — CBC
HCT: 38.4 % (ref 36.0–46.0)
Hemoglobin: 13.4 g/dL (ref 12.0–15.0)
MCH: 32.3 pg (ref 26.0–34.0)
MCHC: 34.9 g/dL (ref 30.0–36.0)
MCV: 92.5 fL (ref 80.0–100.0)
Platelets: 139 10*3/uL — ABNORMAL LOW (ref 150–400)
RBC: 4.15 MIL/uL (ref 3.87–5.11)
RDW: 14.1 % (ref 11.5–15.5)
WBC: 6.9 10*3/uL (ref 4.0–10.5)
nRBC: 0 % (ref 0.0–0.2)

## 2020-07-01 LAB — SARS CORONAVIRUS 2 (TAT 6-24 HRS): SARS Coronavirus 2: NEGATIVE

## 2020-07-01 LAB — TYPE AND SCREEN
ABO/RH(D): O POS
Antibody Screen: NEGATIVE

## 2020-07-01 LAB — RPR: RPR Ser Ql: NONREACTIVE

## 2020-07-01 NOTE — Anesthesia Preprocedure Evaluation (Addendum)
Anesthesia Evaluation  Patient identified by MRN, date of birth, ID band Patient awake    Reviewed: Allergy & Precautions, Patient's Chart, lab work & pertinent test results  Airway Mallampati: II  TM Distance: >3 FB Neck ROM: Full    Dental no notable dental hx. (+) Teeth Intact   Pulmonary neg pulmonary ROS,    Pulmonary exam normal breath sounds clear to auscultation       Cardiovascular negative cardio ROS Normal cardiovascular exam Rhythm:Regular Rate:Normal     Neuro/Psych negative neurological ROS  negative psych ROS   GI/Hepatic negative GI ROS, Neg liver ROS,   Endo/Other  negative endocrine ROS  Renal/GU negative Renal ROS  negative genitourinary   Musculoskeletal negative musculoskeletal ROS (+)   Abdominal (+) + obese,   Peds  Hematology negative hematology ROS (+)   Anesthesia Other Findings   Reproductive/Obstetrics (+) Pregnancy Previous C/Section x 2 Desires sterilization                            Anesthesia Physical Anesthesia Plan  ASA: II  Anesthesia Plan: Spinal   Post-op Pain Management:    Induction:   PONV Risk Score and Plan: 4 or greater and Treatment may vary due to age or medical condition, Scopolamine patch - Pre-op and Ondansetron  Airway Management Planned: Natural Airway  Additional Equipment: None  Intra-op Plan:   Post-operative Plan:   Informed Consent: I have reviewed the patients History and Physical, chart, labs and discussed the procedure including the risks, benefits and alternatives for the proposed anesthesia with the patient or authorized representative who has indicated his/her understanding and acceptance.     Dental advisory given  Plan Discussed with: CRNA and Anesthesiologist  Anesthesia Plan Comments:        Anesthesia Quick Evaluation

## 2020-07-04 ENCOUNTER — Inpatient Hospital Stay (HOSPITAL_COMMUNITY): Payer: 59 | Admitting: Anesthesiology

## 2020-07-04 ENCOUNTER — Encounter (HOSPITAL_COMMUNITY)
Admission: RE | Disposition: A | Discharge status: Home / Self Care | Payer: Self-pay | Source: Home / Self Care | Attending: Obstetrics and Gynecology

## 2020-07-04 ENCOUNTER — Encounter (HOSPITAL_COMMUNITY): Payer: Self-pay | Admitting: Obstetrics and Gynecology

## 2020-07-04 ENCOUNTER — Inpatient Hospital Stay (HOSPITAL_COMMUNITY)
Admission: RE | Admit: 2020-07-04 | Discharge: 2020-07-06 | DRG: 785 | Disposition: A | Discharge status: Home / Self Care | Payer: 59 | Attending: Obstetrics and Gynecology | Admitting: Obstetrics and Gynecology

## 2020-07-04 ENCOUNTER — Inpatient Hospital Stay (HOSPITAL_COMMUNITY): Admit: 2020-07-04 | Payer: Self-pay

## 2020-07-04 ENCOUNTER — Other Ambulatory Visit: Payer: Self-pay

## 2020-07-04 DIAGNOSIS — Z302 Encounter for sterilization: Secondary | ICD-10-CM

## 2020-07-04 DIAGNOSIS — O34211 Maternal care for low transverse scar from previous cesarean delivery: Principal | ICD-10-CM | POA: Diagnosis present

## 2020-07-04 DIAGNOSIS — Z20822 Contact with and (suspected) exposure to covid-19: Secondary | ICD-10-CM | POA: Diagnosis present

## 2020-07-04 DIAGNOSIS — Z3A39 39 weeks gestation of pregnancy: Secondary | ICD-10-CM

## 2020-07-04 SURGERY — Surgical Case
Anesthesia: Spinal | Site: Abdomen | Laterality: Bilateral | Wound class: Clean Contaminated

## 2020-07-04 MED ORDER — DEXAMETHASONE SODIUM PHOSPHATE 4 MG/ML IJ SOLN
INTRAMUSCULAR | Status: AC
  Filled 2020-07-04: qty 1

## 2020-07-04 MED ORDER — DIPHENHYDRAMINE HCL 25 MG PO CAPS
25.0000 mg | ORAL_CAPSULE | ORAL | Status: DC | PRN
Start: 2020-07-04 — End: 2020-07-04

## 2020-07-04 MED ORDER — FENTANYL CITRATE (PF) 100 MCG/2ML IJ SOLN: 25.0000 ug | INTRAMUSCULAR | Status: DC | PRN

## 2020-07-04 MED ORDER — MENTHOL 3 MG MT LOZG: 1.0000 | LOZENGE | OROMUCOSAL | Status: DC | PRN

## 2020-07-04 MED ORDER — NALBUPHINE HCL 10 MG/ML IJ SOLN
5.0000 mg | Freq: Once | INTRAMUSCULAR | Status: DC | PRN
Start: 2020-07-04 — End: 2020-07-06

## 2020-07-04 MED ORDER — SCOPOLAMINE 1 MG/3DAYS TD PT72
MEDICATED_PATCH | TRANSDERMAL | Status: AC
  Filled 2020-07-04: qty 1

## 2020-07-04 MED ORDER — SODIUM CHLORIDE 0.9 % IR SOLN
Status: DC | PRN
  Administered 2020-07-04: 1000 mL

## 2020-07-04 MED ORDER — KETOROLAC TROMETHAMINE 30 MG/ML IJ SOLN: 30.0000 mg | Freq: Four times a day (QID) | INTRAMUSCULAR | Status: DC | PRN

## 2020-07-04 MED ORDER — STERILE WATER FOR IRRIGATION IR SOLN
Status: DC | PRN
  Administered 2020-07-04: 1000 mL

## 2020-07-04 MED ORDER — NALBUPHINE HCL 10 MG/ML IJ SOLN: 5.0000 mg | INTRAMUSCULAR | Status: DC | PRN

## 2020-07-04 MED ORDER — LACTATED RINGERS IV SOLN: INTRAVENOUS | Status: DC

## 2020-07-04 MED ORDER — MEPERIDINE HCL 25 MG/ML IJ SOLN
INTRAMUSCULAR | Status: AC
  Filled 2020-07-04: qty 1

## 2020-07-04 MED ORDER — OXYTOCIN-SODIUM CHLORIDE 30-0.9 UT/500ML-% IV SOLN: 2.5000 [IU]/h | INTRAVENOUS | Status: AC

## 2020-07-04 MED ORDER — ACETAMINOPHEN 325 MG PO TABS
650.0000 mg | ORAL_TABLET | ORAL | Status: DC | PRN
  Administered 2020-07-04: 650 mg via ORAL
  Filled 2020-07-04: qty 2

## 2020-07-04 MED ORDER — MORPHINE SULFATE (PF) 0.5 MG/ML IJ SOLN
INTRAMUSCULAR | Status: DC | PRN
  Administered 2020-07-04: .15 mg via INTRATHECAL

## 2020-07-04 MED ORDER — ZOLPIDEM TARTRATE 5 MG PO TABS: 5.0000 mg | ORAL_TABLET | Freq: Every evening | ORAL | Status: DC | PRN

## 2020-07-04 MED ORDER — PRENATAL MULTIVITAMIN CH
1.0000 | ORAL_TABLET | Freq: Every day | ORAL | Status: DC
  Administered 2020-07-04 – 2020-07-05 (×2): 1 via ORAL
  Filled 2020-07-04 (×2): qty 1

## 2020-07-04 MED ORDER — CEFAZOLIN SODIUM-DEXTROSE 2-4 GM/100ML-% IV SOLN
INTRAVENOUS | Status: AC
  Filled 2020-07-04: qty 100

## 2020-07-04 MED ORDER — SIMETHICONE 80 MG PO CHEW: 80.0000 mg | CHEWABLE_TABLET | ORAL | Status: DC | PRN

## 2020-07-04 MED ORDER — SENNOSIDES-DOCUSATE SODIUM 8.6-50 MG PO TABS
2.0000 | ORAL_TABLET | Freq: Every day | ORAL | Status: DC
  Administered 2020-07-05 – 2020-07-06 (×2): 2 via ORAL
  Filled 2020-07-04 (×2): qty 2

## 2020-07-04 MED ORDER — OXYTOCIN-SODIUM CHLORIDE 30-0.9 UT/500ML-% IV SOLN
INTRAVENOUS | Status: AC
  Filled 2020-07-04: qty 500

## 2020-07-04 MED ORDER — DEXAMETHASONE SODIUM PHOSPHATE 4 MG/ML IJ SOLN
INTRAMUSCULAR | Status: DC | PRN
  Administered 2020-07-04: 4 mg via INTRAVENOUS

## 2020-07-04 MED ORDER — OXYCODONE HCL 5 MG PO TABS
5.0000 mg | ORAL_TABLET | ORAL | Status: DC | PRN
Start: 2020-07-04 — End: 2020-07-06

## 2020-07-04 MED ORDER — WITCH HAZEL-GLYCERIN EX PADS: 1.0000 "application " | MEDICATED_PAD | CUTANEOUS | Status: DC | PRN

## 2020-07-04 MED ORDER — PHENYLEPHRINE HCL-NACL 20-0.9 MG/250ML-% IV SOLN
INTRAVENOUS | Status: DC | PRN
  Administered 2020-07-04: 60 ug/min via INTRAVENOUS

## 2020-07-04 MED ORDER — HYDROMORPHONE HCL 1 MG/ML IJ SOLN: 0.2000 mg | INTRAMUSCULAR | Status: DC | PRN

## 2020-07-04 MED ORDER — ACETAMINOPHEN 500 MG PO TABS
1000.0000 mg | ORAL_TABLET | Freq: Four times a day (QID) | ORAL | Status: DC | PRN
  Administered 2020-07-04 – 2020-07-06 (×6): 1000 mg via ORAL
  Filled 2020-07-04 (×6): qty 2

## 2020-07-04 MED ORDER — TETANUS-DIPHTH-ACELL PERTUSSIS 5-2.5-18.5 LF-MCG/0.5 IM SUSY: 0.5000 mL | PREFILLED_SYRINGE | Freq: Once | INTRAMUSCULAR | Status: DC

## 2020-07-04 MED ORDER — SOD CITRATE-CITRIC ACID 500-334 MG/5ML PO SOLN
ORAL | Status: AC
  Filled 2020-07-04: qty 30

## 2020-07-04 MED ORDER — CEFAZOLIN SODIUM-DEXTROSE 2-4 GM/100ML-% IV SOLN
2.0000 g | INTRAVENOUS | Status: AC
  Administered 2020-07-04: 2 g via INTRAVENOUS

## 2020-07-04 MED ORDER — POVIDONE-IODINE 10 % EX SWAB
2.0000 "application " | Freq: Once | CUTANEOUS | Status: AC
  Administered 2020-07-04: 2 via TOPICAL

## 2020-07-04 MED ORDER — DIPHENHYDRAMINE HCL 25 MG PO CAPS: 25.0000 mg | ORAL_CAPSULE | Freq: Four times a day (QID) | ORAL | Status: DC | PRN

## 2020-07-04 MED ORDER — FENTANYL CITRATE (PF) 100 MCG/2ML IJ SOLN
INTRAMUSCULAR | Status: DC | PRN
  Administered 2020-07-04: 15 ug via INTRATHECAL

## 2020-07-04 MED ORDER — OXYTOCIN-SODIUM CHLORIDE 30-0.9 UT/500ML-% IV SOLN
INTRAVENOUS | Status: DC | PRN
Start: 2020-07-04 — End: 2020-07-04
  Administered 2020-07-04: 300 mL via INTRAVENOUS

## 2020-07-04 MED ORDER — BUPIVACAINE IN DEXTROSE 0.75-8.25 % IT SOLN
INTRATHECAL | Status: DC | PRN
  Administered 2020-07-04: 1.6 mL via INTRATHECAL

## 2020-07-04 MED ORDER — SOD CITRATE-CITRIC ACID 500-334 MG/5ML PO SOLN
30.0000 mL | ORAL | Status: AC
  Administered 2020-07-04: 30 mL via ORAL

## 2020-07-04 MED ORDER — ONDANSETRON HCL 4 MG/2ML IJ SOLN
INTRAMUSCULAR | Status: AC
  Filled 2020-07-04: qty 2

## 2020-07-04 MED ORDER — MEPERIDINE HCL 25 MG/ML IJ SOLN
INTRAMUSCULAR | Status: DC | PRN
Start: 2020-07-04 — End: 2020-07-04
  Administered 2020-07-04 (×2): 12.5 mg via INTRAVENOUS

## 2020-07-04 MED ORDER — MEPERIDINE HCL 25 MG/ML IJ SOLN: 6.2500 mg | INTRAMUSCULAR | Status: DC | PRN

## 2020-07-04 MED ORDER — DIBUCAINE (PERIANAL) 1 % EX OINT: 1.0000 "application " | TOPICAL_OINTMENT | CUTANEOUS | Status: DC | PRN

## 2020-07-04 MED ORDER — NALOXONE HCL 0.4 MG/ML IJ SOLN: 0.4000 mg | INTRAMUSCULAR | Status: DC | PRN

## 2020-07-04 MED ORDER — FENTANYL CITRATE (PF) 100 MCG/2ML IJ SOLN
INTRAMUSCULAR | Status: AC
  Filled 2020-07-04: qty 2

## 2020-07-04 MED ORDER — ONDANSETRON HCL 4 MG/2ML IJ SOLN
INTRAMUSCULAR | Status: DC | PRN
  Administered 2020-07-04: 4 mg via INTRAVENOUS

## 2020-07-04 MED ORDER — PHENYLEPHRINE HCL-NACL 20-0.9 MG/250ML-% IV SOLN
INTRAVENOUS | Status: AC
  Filled 2020-07-04: qty 250

## 2020-07-04 MED ORDER — ONDANSETRON HCL 4 MG/2ML IJ SOLN
4.0000 mg | Freq: Three times a day (TID) | INTRAMUSCULAR | Status: DC | PRN
  Administered 2020-07-04: 4 mg via INTRAVENOUS
  Filled 2020-07-04: qty 2

## 2020-07-04 MED ORDER — COCONUT OIL OIL: 1.0000 "application " | TOPICAL_OIL | Status: DC | PRN

## 2020-07-04 MED ORDER — NALOXONE HCL 4 MG/10ML IJ SOLN
1.0000 ug/kg/h | INTRAVENOUS | Status: DC | PRN
  Filled 2020-07-04: qty 5

## 2020-07-04 MED ORDER — MORPHINE SULFATE (PF) 0.5 MG/ML IJ SOLN
INTRAMUSCULAR | Status: AC
  Filled 2020-07-04: qty 10

## 2020-07-04 MED ORDER — SIMETHICONE 80 MG PO CHEW
80.0000 mg | CHEWABLE_TABLET | Freq: Three times a day (TID) | ORAL | Status: DC
  Administered 2020-07-04 – 2020-07-06 (×6): 80 mg via ORAL
  Filled 2020-07-04 (×6): qty 1

## 2020-07-04 MED ORDER — DIPHENHYDRAMINE HCL 50 MG/ML IJ SOLN: 12.5000 mg | INTRAMUSCULAR | Status: DC | PRN

## 2020-07-04 MED ORDER — SODIUM CHLORIDE 0.9% FLUSH: 3.0000 mL | INTRAVENOUS | Status: DC | PRN

## 2020-07-04 SURGICAL SUPPLY — 37 items
BENZOIN TINCTURE PRP APPL 2/3 (GAUZE/BANDAGES/DRESSINGS) ×4 IMPLANT
CLAMP CORD UMBIL (MISCELLANEOUS) IMPLANT
CLOSURE STERI STRIP 1/2 X4 (GAUZE/BANDAGES/DRESSINGS) ×2 IMPLANT
CLOTH BEACON ORANGE TIMEOUT ST (SAFETY) ×2 IMPLANT
DERMABOND ADVANCED (GAUZE/BANDAGES/DRESSINGS)
DERMABOND ADVANCED .7 DNX12 (GAUZE/BANDAGES/DRESSINGS) IMPLANT
DRSG OPSITE POSTOP 4X10 (GAUZE/BANDAGES/DRESSINGS) ×2 IMPLANT
ELECT REM PT RETURN 9FT ADLT (ELECTROSURGICAL) ×2
ELECTRODE REM PT RTRN 9FT ADLT (ELECTROSURGICAL) ×1 IMPLANT
EXTRACTOR VACUUM M CUP 4 TUBE (SUCTIONS) IMPLANT
GAUZE SPONGE 4X4 12PLY STRL LF (GAUZE/BANDAGES/DRESSINGS) ×4 IMPLANT
GLOVE BIO SURGEON STRL SZ7.5 (GLOVE) ×2 IMPLANT
GLOVE BIOGEL PI IND STRL 7.0 (GLOVE) ×1 IMPLANT
GLOVE BIOGEL PI INDICATOR 7.0 (GLOVE) ×1
GOWN STRL REUS W/TWL LRG LVL3 (GOWN DISPOSABLE) ×4 IMPLANT
KIT ABG SYR 3ML LUER SLIP (SYRINGE) ×2 IMPLANT
NEEDLE HYPO 22GX1.5 SAFETY (NEEDLE) ×2 IMPLANT
NEEDLE HYPO 25X5/8 SAFETYGLIDE (NEEDLE) ×2 IMPLANT
NS IRRIG 1000ML POUR BTL (IV SOLUTION) ×2 IMPLANT
PACK C SECTION WH (CUSTOM PROCEDURE TRAY) ×2 IMPLANT
PAD ABD 7.5X8 STRL (GAUZE/BANDAGES/DRESSINGS) ×2 IMPLANT
PAD ABD 8X10 STRL (GAUZE/BANDAGES/DRESSINGS) ×2 IMPLANT
PAD OB MATERNITY 4.3X12.25 (PERSONAL CARE ITEMS) ×2 IMPLANT
PENCIL SMOKE EVAC W/HOLSTER (ELECTROSURGICAL) ×2 IMPLANT
SPONGE GAUZE 4X4 12PLY STER LF (GAUZE/BANDAGES/DRESSINGS) ×4 IMPLANT
STRIP CLOSURE SKIN 1/2X4 (GAUZE/BANDAGES/DRESSINGS) ×2 IMPLANT
SUT MNCRL 0 VIOLET CTX 36 (SUTURE) ×4 IMPLANT
SUT MONOCRYL 0 CTX 36 (SUTURE) ×4
SUT PDS AB 0 CTX 60 (SUTURE) ×2 IMPLANT
SUT PLAIN 0 NONE (SUTURE) IMPLANT
SUT PLAIN 2 0 (SUTURE)
SUT PLAIN 2 0 XLH (SUTURE) ×2 IMPLANT
SUT PLAIN ABS 2-0 CT1 27XMFL (SUTURE) IMPLANT
SUT VIC AB 4-0 KS 27 (SUTURE) ×2 IMPLANT
TOWEL OR 17X24 6PK STRL BLUE (TOWEL DISPOSABLE) ×2 IMPLANT
TRAY FOLEY W/BAG SLVR 14FR LF (SET/KITS/TRAYS/PACK) ×2 IMPLANT
WATER STERILE IRR 1000ML POUR (IV SOLUTION) ×2 IMPLANT

## 2020-07-04 NOTE — Brief Op Note (Signed)
07/04/2020  8:26 AM  PATIENT:  Cynthia [name]  35 y.o. female  PRE-OPERATIVE DIAGNOSIS:  TWO PREVIOUS CESAREAN SECTIONS, DESIRES STERILITY  POST-OPERATIVE DIAGNOSIS:  TWO PREVIOUS CESAREAN SECTIONS, DESIRES STERILITY  PROCEDURE:  Procedure(s): REPEAT CESAREAN SECTION WITH BILATERAL TUBAL LIGATION EDC: 07-10-20 PREVIOUS X 2  ALLERGIES: NKDA (Bilateral)  SURGEON:  Surgeon(s) and Role:    * Harold Hedge, MD - Primary  PHYSICIAN ASSISTANT:   ASSISTANTS: none   ANESTHESIA:   spinal  EBL:  100 mL   BLOOD ADMINISTERED:none  DRAINS: Urinary Catheter (Foley)   LOCAL MEDICATIONS USED:  NONE  SPECIMEN:  Source of Specimen:  segments of bilateral fallopian tubes  DISPOSITION OF SPECIMEN:  PATHOLOGY  COUNTS:  YES  TOURNIQUET:  * No tourniquets in log *  DICTATION: .Other Dictation: Dictation Number [id number]  PLAN OF CARE: Admit to inpatient   PATIENT DISPOSITION:  PACU - hemodynamically stable.   Delay start of Pharmacological VTE agent (>24hrs) due to surgical blood loss or risk of bleeding: not applicable

## 2020-07-04 NOTE — Progress Notes (Signed)
Holding Ibuprofen and Toradol for now per Dr. Henderson Cloud.  Tylenol 1,000mg  q6hr prn pain and Oxycodone IR prn.    Pt up to bathroom once, Ortho VS stable.  Pain 5/10 when ambulating/repositioning.    Pt did have one episode of large amt emesis around 1320 today (had eaten saltines, bagel, water, juice).  Zofran 4mg  IV given a 1329 and rested afterwards. Was on clear liquids and will now try solid/soft foods.  Did fine with saltines.  HC dsg became 60% saturated and it was changed; pressure dsg applied on top per MD orders.  Will continue to monitor.

## 2020-07-04 NOTE — Anesthesia Postprocedure Evaluation (Signed)
Anesthesia Post Note  Patient: Cynthia [name]  Procedure(s) Performed: REPEAT CESAREAN SECTION WITH BILATERAL TUBAL LIGATION EDC: 07-10-20 PREVIOUS X 2  ALLERGIES: NKDA (Bilateral Abdomen)     Patient location during evaluation: PACU Anesthesia Type: Spinal Level of consciousness: oriented and awake and alert Pain management: pain level controlled Vital Signs Assessment: post-procedure vital signs reviewed and stable Respiratory status: spontaneous breathing, respiratory function stable and nonlabored ventilation Cardiovascular status: blood pressure returned to baseline and stable Postop Assessment: no headache, no backache, no apparent nausea or vomiting, spinal receding and patient able to bend at knees Anesthetic complications: no   No complications documented.  Last Vitals:  Vitals:   07/04/20 0900 07/04/20 0915  BP: 108/69 99/61  Pulse: 61 60  Resp: 17 12  Temp:    SpO2: 98% 98%    Last Pain:  Vitals:   07/04/20 0915  TempSrc:   PainSc: 0-No pain   Pain Goal:    LLE Motor Response: Purposeful movement (07/04/20 0915)   RLE Motor Response: Purposeful movement (07/04/20 0915)       Epidural/Spinal Function Cutaneous sensation: Able to Discern Pressure (07/04/20 0915), Patient able to flex knees: Yes (07/04/20 0915), Patient able to lift hips off bed: No (07/04/20 0915), Back pain beyond tenderness at insertion site: No (07/04/20 0915), Progressively worsening motor and/or sensory loss: No (07/04/20 0915), Bowel and/or bladder incontinence post epidural: No (07/04/20 0915)  Jobani Sabado A.

## 2020-07-04 NOTE — Op Note (Signed)
NAMEAVIGAIL, Cynthia [name] MEDICAL RECORD [id number] ACCOUNT [id number] DATE OF BIRTH:30-Nov-1985 FACILITY: [facility] LOCATION: [facility] PHYSICIAN:Erin Obando E. Froylan Hobby II, MD  OPERATIVE REPORT  DATE OF PROCEDURE:  07/04/2020  PREOPERATIVE DIAGNOSES:   1.  Previous cesarean section x2 desires repeat. 2.  Desires permanent sterilization.  POSTOPERATIVE DIAGNOSES:   1.  Previous cesarean section x2 desires repeat. 2.  Desires permanent sterilization.  PROCEDURE:  Repeat low transverse cesarean section with bilateral tubal ligation.  SURGEON:  Retta Mac, MD  ANESTHESIA:  Spinal, Mal Amabile, MD  ESTIMATED BLOOD LOSS:  100 mL  SPECIMENS:  Segments of bilateral fallopian tubes to pathology.  FINDINGS:  Viable female infant, weight, Apgars, arterial cord pH pending at the time of dictation.  INDICATIONS AND CONSENT:  This patient is a 35 year old G3 P2 at 39-1/7 weeks with 2 previous cesarean sections.  She desires repeat.  She also desires permanent sterilization.  Potential risks and complications have been reviewed preoperatively  including but not limited to infection, organ damage, bleeding requiring transfusion of blood products with HIV and hepatitis acquisition, DVT, PE, pneumonia.  Tubal ligation with permanence, failure rate and increased ectopic risk have also been  reviewed.  She states she understands and agrees.  All questions have been signed and consent was signed on the chart.  DESCRIPTION OF PROCEDURE:  The patient was taken to the operating room where she was identified.  Spinal anesthetic was placed per Dr. Malen Gauze and she was placed in the dorsal supine position with a 15-degree left lateral wedge.  She was prepped vaginally  with Betadine.  Foley catheter placed and prepped abdominally with ChloraPrep.  Three minute drying time was noted.  She was then draped in sterile fashion.  Timeout was undertaken.  After testing for adequate spinal anesthesia, skin was entered  through  the previous Pfannenstiel scar and dissection was carried out in layers to the peritoneum, which was entered and extended superiorly and inferiorly.  Bladder flap was taken down bilaterally and advanced sharply and bluntly.  Uterus was incised in a low  transverse manner with a scalpel.  The uterine cavity was then entered bluntly with a hemostat.  Clear fluid was noted.  The incision was extended bilaterally with fingers.  Vertex was then delivered through the incision with a mushroom vacuum extractor  with 1 gentle pull and no pop-offs.  There was a loose nuchal cord x1 that was reduced and the baby was fully delivered.  Good cry and tone was noted.  After 1 minute, the cord was clamped and cut and the baby was handed to the waiting pediatrics team.   Placenta was manually delivered.  Uterine cavity was clean.  Uterus was closed in 2 running locking imbricating layers of 0 Monocryl suture, which achieved good hemostasis.  Ovaries are normal bilaterally.  Left fallopian tube was identified from cornu  to fimbria, grasped in its mid ampullary portion with Babcock clamp and a knuckle of tube was then doubly ligated with 2 free ties of plain suture.  The intervening knuckle was sharply resected, and cautery was used to assure hemostasis.  Similar  procedure was carried out on the right side.  Lavage was then carried out.  Anterior peritoneum was closed in a running fashion with 0 Monocryl suture, which was also used to reapproximate the pyramidalis muscle in the midline.  Anterior rectus fascia  was closed in a running fashion with a 0 looped PDS.  Subcutaneous layer was closed with interrupted plain and the  skin was closed in a subcuticular fashion with a 4-0 Vicryl on a Keith needle.  Benzoin, Steri-Strips, honeycomb and pressure dressing is  applied.  All counts were correct.  The patient was taken to recovery room in stable condition.  IN/NUANCE  D:07/04/2020 T:07/04/2020 JOB:[id number]

## 2020-07-04 NOTE — Anesthesia Procedure Notes (Signed)
Spinal  Patient location during procedure: OR Start time: 07/04/2020 7:26 AM End time: 07/04/2020 7:29 AM Staffing Performed: anesthesiologist  Anesthesiologist: Mal Amabile, MD Preanesthetic Checklist Completed: patient identified, IV checked, site marked, risks and benefits discussed, surgical consent, monitors and equipment checked, pre-op evaluation and timeout performed Spinal Block Patient position: sitting Prep: DuraPrep and site prepped and draped Patient monitoring: heart rate, cardiac monitor, continuous pulse ox and blood pressure Approach: midline Location: L3-4 Injection technique: single-shot Needle Needle type: Pencan  Needle gauge: 24 G Needle length: 9 cm Needle insertion depth: 6 cm Assessment Sensory level: T4 Additional Notes Patient tolerated procedure well. Adequate sensory level.

## 2020-07-04 NOTE — Transfer of Care (Signed)
Immediate Anesthesia Transfer of Care Note  Patient: Cynthia [name]  Procedure(s) Performed: REPEAT CESAREAN SECTION WITH BILATERAL TUBAL LIGATION EDC: 07-10-20 PREVIOUS X 2  ALLERGIES: NKDA (Bilateral Abdomen)  Patient Location: PACU  Anesthesia Type:Epidural  Level of Consciousness: awake  Airway & Oxygen Therapy: Patient Spontanous Breathing  Post-op Assessment: Post -op Vital signs reviewed and stable  Post vital signs: Reviewed and stable  Last Vitals:  Vitals Value Taken Time  BP 103/64 07/04/20 0930  Temp 36.7 C 07/04/20 0930  Pulse 63 07/04/20 0932  Resp 16 07/04/20 0932  SpO2 99 % 07/04/20 0932  Vitals shown include unvalidated device data.  Last Pain:  Vitals:   07/04/20 0930  TempSrc:   PainSc: 0-No pain         Complications: No complications documented.

## 2020-07-04 NOTE — Lactation Note (Signed)
This note was copied from a baby's chart. Lactation Consultation Note  Patient Name: Cynthia [name] [id number] Date: 07/04/2020 Age:36 hours  LC contacted Cynthia [name] due to previous note declining Lactation Services.  Per RN, mother is not feeling well at this time and will contact LC when she is ready to be seen.    [name] A Higuera Ancidey 07/04/2020, 1:31 PM

## 2020-07-04 NOTE — H&P (Signed)
Cynthia [name] is a 35 y.o. female presenting for repeat C/S with BTL. Pregnancy complicated by AMA>declines chromosome evaluation. History of C/S x 2. OB History    Gravida  4   Para  2   Term  2   Preterm      AB      Living  2     SAB      IAB      Ectopic      Multiple  0   Live Births  2          Past Medical History:  Diagnosis Date  . Hx of varicella   . Vaginal Pap smear, abnormal    Past Surgical History:  Procedure Laterality Date  . CESAREAN SECTION N/A 07/02/2015   Procedure: CESAREAN SECTION;  Surgeon: Harold Hedge, MD;  Location: [facility];  Service: Obstetrics;  Laterality: N/A;  . CESAREAN SECTION N/A 03/04/2017   Procedure: REPEAT CESAREAN SECTION;  Surgeon: Harold Hedge, MD;  Location: [facility];  Service: Obstetrics;  Laterality: N/A;  Repeat edc 03/09/17 NKDA Tracey RNFA  . COLPOSCOPY    . MYRINGOTOMY    . WISDOM TOOTH EXTRACTION     Family History: family history is not on file. Social History:  reports that she has never smoked. She has never used smokeless tobacco. She reports that she does not drink alcohol and does not use drugs.     Maternal Diabetes: No Genetic Screening: Normal Maternal Ultrasounds/Referrals: Normal Fetal Ultrasounds or other Referrals:  None Maternal Substance Abuse:  No Significant Maternal Medications:  None Significant Maternal Lab Results:  Group B Strep negative Other Comments:  None  Review of Systems  Constitutional: Negative for fever.  Eyes: Negative for visual disturbance.  Gastrointestinal: Negative for abdominal pain.  Neurological: Negative for headaches.   Maternal Medical History:  Fetal activity: Perceived fetal activity is normal.        Blood pressure 128/80, pulse 78, temperature 97.7 F (36.5 C), temperature source Oral, resp. rate 18, height 5\' 2"  (1.575 m), weight 80.3 kg, SpO2 100 %, unknown if currently breastfeeding. Maternal Exam:  Abdomen: Fetal presentation:  vertex     Physical Exam Cardiovascular:     Rate and Rhythm: Normal rate.  Pulmonary:     Effort: Pulmonary effort is normal.     Prenatal labs: ABO, Rh: --/--/O POS (01/07 06-12-1972) Antibody: NEG (01/07 06-12-1972) Rubella:   RPR: NON REACTIVE (01/07 0930)  HBsAg:    HIV:    GBS:   negative 06/14/20  Assessment/Plan: 35 yo G3 P2 for repeat C/S and BTL  D/W risks including infection, organ damage, bleeding/transfusion-HIV/Hep, DVT/PE, pneumonia. Also permanence, failure rate and increased ectopic risk of BTL. Patient states she understands and agrees.  31 II 07/04/2020, 7:21 AM

## 2020-07-05 LAB — CBC
HCT: 38.3 % (ref 36.0–46.0)
Hemoglobin: 12.8 g/dL (ref 12.0–15.0)
MCH: 31.3 pg (ref 26.0–34.0)
MCHC: 33.4 g/dL (ref 30.0–36.0)
MCV: 93.6 fL (ref 80.0–100.0)
Platelets: 149 K/uL — ABNORMAL LOW (ref 150–400)
RBC: 4.09 MIL/uL (ref 3.87–5.11)
RDW: 13.9 % (ref 11.5–15.5)
WBC: 10 K/uL (ref 4.0–10.5)
nRBC: 0 % (ref 0.0–0.2)

## 2020-07-05 LAB — SURGICAL PATHOLOGY

## 2020-07-05 LAB — BIRTH TISSUE RECOVERY COLLECTION (PLACENTA DONATION)

## 2020-07-05 MED ORDER — IBUPROFEN 600 MG PO TABS
600.0000 mg | ORAL_TABLET | Freq: Four times a day (QID) | ORAL | Status: DC | PRN
  Administered 2020-07-05 – 2020-07-06 (×4): 600 mg via ORAL
  Filled 2020-07-05 (×4): qty 1

## 2020-07-05 NOTE — Progress Notes (Signed)
Subjective: Postpartum Day 1: Cesarean Delivery Patient reports tolerating PO, + flatus and no problems voiding.    Objective: Vital signs in last 24 hours: Temp:  [97.7 F (36.5 C)-98.7 F (37.1 C)] 98.1 F (36.7 C) (01/11 0423) Pulse Rate:  [55-71] 65 (01/11 0423) Resp:  [14-18] 18 (01/11 0423) BP: (93-123)/(54-78) 94/57 (01/11 0423) SpO2:  [97 %-100 %] 100 % (01/11 0423)  Physical Exam:  General: alert, cooperative, appears stated age and no distress Lochia: appropriate Uterine Fundus: firm Incision: healing well DVT Evaluation: No evidence of DVT seen on physical exam.  Recent Labs    07/05/20 0553  HGB 12.8  HCT 38.3    Assessment/Plan: Status post Cesarean section. Doing well postoperatively.  Continue current care.  Turner Daniels 07/05/2020, 9:36 AM

## 2020-07-05 NOTE — Lactation Note (Signed)
This note was copied from a baby's chart. Lactation Consultation Note  Patient Name: Cynthia [name] [id number]'[id number] Date: 07/05/2020   Age:36 hours  LC reached out to RN, Bernita Raisin, to see if Mom needed assistance with lactation. Mom declined LC services at this time.

## 2020-07-05 NOTE — Lactation Note (Signed)
This note was copied from a baby's chart. Lactation Consultation Note Mom declines Lactation  Patient Name: Cynthia [name] [id number] Date: 07/05/2020   Age:36 hours  Maternal Data    Feeding    LATCH Score                   Interventions    Lactation Tools Discussed/Used     Consult Status Consult Status: Complete Date: 07/05/20    Charyl Dancer 07/05/2020, 11:33 PM

## 2020-07-06 MED ORDER — IBUPROFEN 600 MG PO TABS: 600.0000 mg | ORAL_TABLET | Freq: Four times a day (QID) | ORAL | 0 refills | Status: AC | PRN

## 2020-07-06 MED ORDER — ACETAMINOPHEN 500 MG PO TABS: 1000.0000 mg | ORAL_TABLET | Freq: Four times a day (QID) | ORAL | 0 refills | Status: AC | PRN

## 2020-07-06 MED ORDER — OXYCODONE HCL 5 MG PO TABS: 5.0000 mg | ORAL_TABLET | ORAL | 0 refills | Status: AC | PRN

## 2020-07-06 NOTE — Progress Notes (Signed)
Postpartum Progress Note  Postpartum Day 2 s/p repeat Cesarean section with tubal sterilization.  Subjective:  Patient reports no overnight events.  She reports well controlled pain, ambulating without difficulty, voiding spontaneously, tolerating PO.  She reports Positive flatus.  Vaginal bleeding is minimal.  Objective: Blood pressure 111/64, pulse 66, temperature 98.1 F (36.7 C), temperature source Oral, resp. rate 18, height 5\' 2"  (1.575 m), weight 80.3 kg, SpO2 98 %, unknown if currently breastfeeding.  Physical Exam:  General: alert and no distress Lochia: appropriate Uterine Fundus: firm Incision: clean/dry/intact DVT Evaluation: No evidence of DVT seen on physical exam.  Recent Labs    07/05/20 0553  HGB 12.8  HCT 38.3    Assessment/Plan: . Postpartum Day 2, s/p C-section . Lactation following . Baby s/p circ . Doing well, desires discharge home today   LOS: 2 days   09/02/20 07/06/2020, 8:05 AM

## 2020-07-06 NOTE — Discharge Summary (Signed)
Obstetric Discharge Summary  Cynthia [name] is a 35 y.o. female that presented on 07/04/2020 for scheduled repeat C section with bilateral tubal ligation.  She was admitted to labor and delivery for her procedure.  She delivered a viable female infant on 07/04/2020.  Her postpartum course was uncomplicated and on PPD#2, she reported well controlled pain, spontaneous voiding, ambulating without difficulty, and tolerating PO.  She was stable for discharge home on 07/06/2020 with plans for in-office follow up.  Hemoglobin  Date Value Ref Range Status  07/05/2020 12.8 12.0 - 15.0 g/dL Final   HCT  Date Value Ref Range Status  07/05/2020 38.3 36.0 - 46.0 % Final    Physical Exam:  General: alert and no distress Lochia: appropriate Uterine Fundus: firm Incision: healing well DVT Evaluation: No evidence of DVT seen on physical exam.  Discharge Diagnoses: Term Pregnancy-delivered  Discharge Information: Date: 07/06/2020 Activity: pelvic rest and activity as tolerated, no heavy lifting Diet: routine Medications: tylenol, motrin, oxycodone Condition: stable Instructions: refer to practice specific booklet Discharge to: home  Follow-up Information    [place] Follow up.   Why: Please follow up for 6 week postpartum visit.  Contact information: [address] [place] [address] [phone]               Newborn Data: Live born female  Birth Weight: 7 lb 15.2 oz (3605 g) APGAR: 9, 9  Newborn Delivery   Birth date/time: 07/04/2020 07:50:00 Delivery type: C-Section, Vacuum Assisted Trial of labor: No C-section categorization: Repeat      Home with mother.  Lyn Henri 07/06/2020, 11:59 AM

## 2020-07-31 NOTE — Progress Notes (Signed)
Chart audit
